# Patient Record
Sex: Male | Born: 1975 | Race: Black or African American | Hispanic: No | Marital: Married | Smoking: Former smoker
Health system: Southern US, Community
[De-identification: ages and names within clinical notes are randomized; demographics above are authoritative.]

## PROBLEM LIST (undated history)

## (undated) DIAGNOSIS — K219 Gastro-esophageal reflux disease without esophagitis: Secondary | ICD-10-CM

## (undated) DIAGNOSIS — I10 Essential (primary) hypertension: Secondary | ICD-10-CM

## (undated) DIAGNOSIS — E7800 Pure hypercholesterolemia, unspecified: Secondary | ICD-10-CM

## (undated) DIAGNOSIS — E781 Pure hyperglyceridemia: Secondary | ICD-10-CM

---

## 2005-04-11 ENCOUNTER — Emergency Department: Payer: Self-pay | Admitting: Emergency Medicine

## 2006-04-29 ENCOUNTER — Ambulatory Visit: Payer: Self-pay | Admitting: General Practice

## 2008-02-15 IMAGING — CR RIGHT ELBOW - COMPLETE 3+ VIEW
1 series · 4 of 4 positions shown · non-contrast
Comparison: none

REASON FOR EXAM: Fall, pain                                      CALL
REPORT TO DR MERDJAN: [phone]
COMMENTS:

PROCEDURE:     DXR - DXR ELBOW RT COMP W/OBLIQUES  - April 29, 2006  [DATE]
RESULT:     Four views of the LEFT elbow show no fracture, dislocation or
other acute bony abnormality.

[Series 1: view not recorded · 0.17mm/px · 4 of 4 slices shown]
[im 1/4]
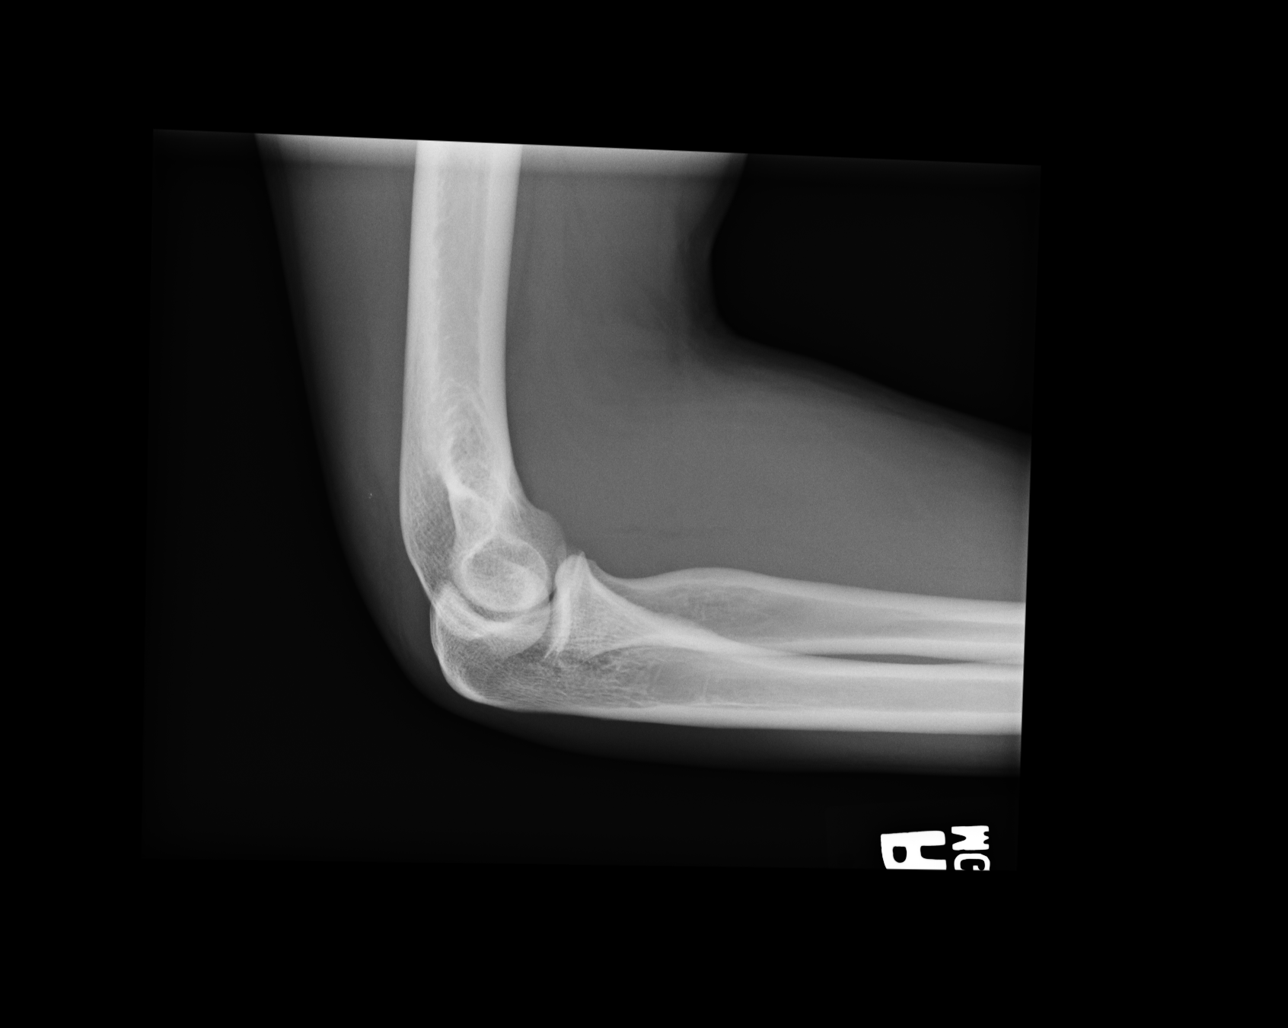
[im 2/4]
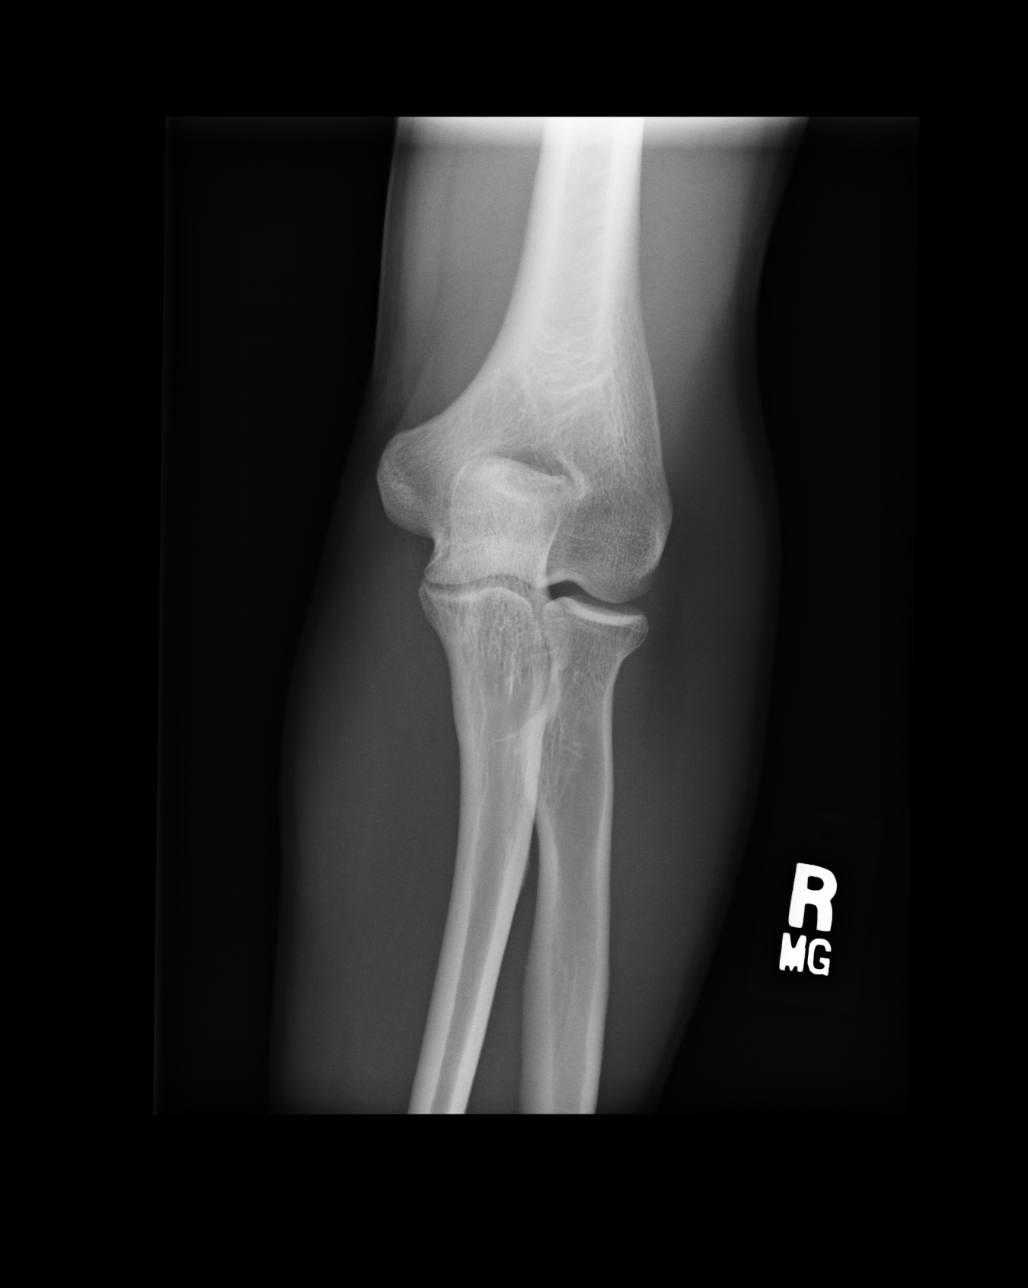
[im 3/4]
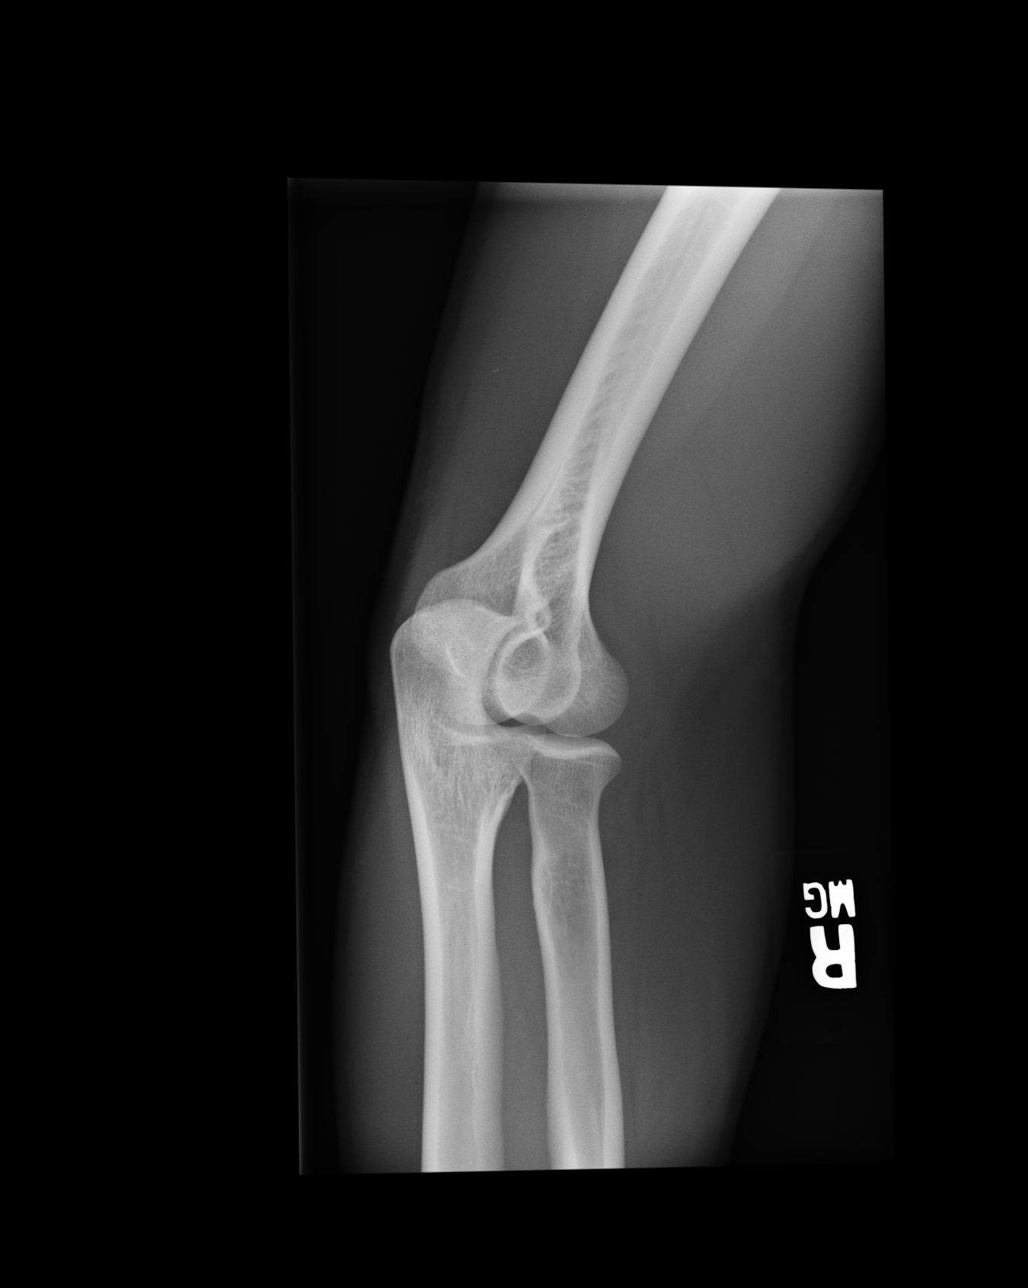
[im 4/4]
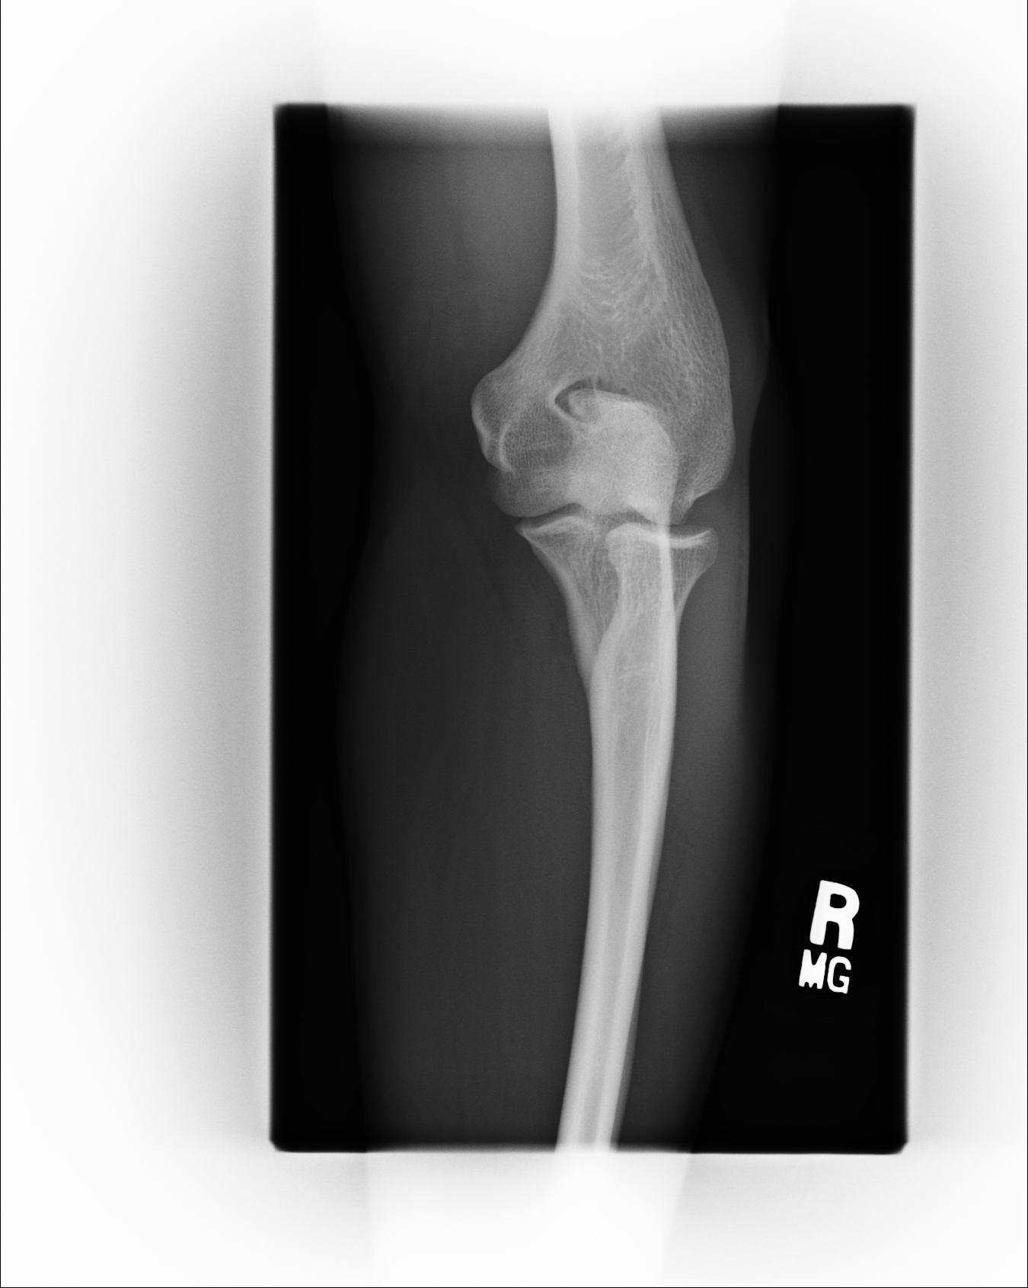

[4 of 4 positions shown; findings below may reference images not displayed]

IMPRESSION: No significant osseous abnormalities are noted.

## 2010-04-18 ENCOUNTER — Ambulatory Visit: Payer: Self-pay | Admitting: Internal Medicine

## 2010-04-24 ENCOUNTER — Ambulatory Visit: Payer: Self-pay | Admitting: Gastroenterology

## 2010-05-04 LAB — PATHOLOGY REPORT

## 2010-11-30 ENCOUNTER — Ambulatory Visit: Payer: Self-pay | Admitting: Internal Medicine

## 2018-09-10 ENCOUNTER — Encounter: Payer: Self-pay | Admitting: Emergency Medicine

## 2018-09-10 ENCOUNTER — Other Ambulatory Visit: Payer: Self-pay

## 2018-09-10 ENCOUNTER — Emergency Department: Payer: BLUE CROSS/BLUE SHIELD

## 2018-09-10 ENCOUNTER — Inpatient Hospital Stay
Admission: EM | Admit: 2018-09-10 | Discharge: 2018-09-14 | DRG: 439 | Disposition: A | Discharge status: Home / Self Care | Payer: BLUE CROSS/BLUE SHIELD | Attending: Internal Medicine | Admitting: Internal Medicine

## 2018-09-10 DIAGNOSIS — Z79899 Other long term (current) drug therapy: Secondary | ICD-10-CM

## 2018-09-10 DIAGNOSIS — K219 Gastro-esophageal reflux disease without esophagitis: Secondary | ICD-10-CM | POA: Diagnosis not present

## 2018-09-10 DIAGNOSIS — E781 Pure hyperglyceridemia: Secondary | ICD-10-CM | POA: Diagnosis present

## 2018-09-10 DIAGNOSIS — R509 Fever, unspecified: Secondary | ICD-10-CM | POA: Diagnosis not present

## 2018-09-10 DIAGNOSIS — J9811 Atelectasis: Secondary | ICD-10-CM | POA: Diagnosis not present

## 2018-09-10 DIAGNOSIS — K852 Alcohol induced acute pancreatitis without necrosis or infection: Principal | ICD-10-CM | POA: Diagnosis present

## 2018-09-10 DIAGNOSIS — Z87891 Personal history of nicotine dependence: Secondary | ICD-10-CM | POA: Diagnosis not present

## 2018-09-10 DIAGNOSIS — I10 Essential (primary) hypertension: Secondary | ICD-10-CM | POA: Diagnosis not present

## 2018-09-10 DIAGNOSIS — R109 Unspecified abdominal pain: Secondary | ICD-10-CM | POA: Diagnosis present

## 2018-09-10 DIAGNOSIS — E7800 Pure hypercholesterolemia, unspecified: Secondary | ICD-10-CM | POA: Diagnosis present

## 2018-09-10 DIAGNOSIS — K7010 Alcoholic hepatitis without ascites: Secondary | ICD-10-CM | POA: Diagnosis present

## 2018-09-10 DIAGNOSIS — E1165 Type 2 diabetes mellitus with hyperglycemia: Secondary | ICD-10-CM | POA: Diagnosis present

## 2018-09-10 DIAGNOSIS — R188 Other ascites: Secondary | ICD-10-CM | POA: Diagnosis not present

## 2018-09-10 DIAGNOSIS — F10188 Alcohol abuse with other alcohol-induced disorder: Secondary | ICD-10-CM | POA: Diagnosis present

## 2018-09-10 DIAGNOSIS — K859 Acute pancreatitis without necrosis or infection, unspecified: Secondary | ICD-10-CM | POA: Diagnosis not present

## 2018-09-10 DIAGNOSIS — Z8249 Family history of ischemic heart disease and other diseases of the circulatory system: Secondary | ICD-10-CM

## 2018-09-10 HISTORY — DX: Gastro-esophageal reflux disease without esophagitis: K21.9

## 2018-09-10 HISTORY — DX: Pure hypercholesterolemia, unspecified: E78.00

## 2018-09-10 HISTORY — DX: Pure hyperglyceridemia: E78.1

## 2018-09-10 HISTORY — DX: Essential (primary) hypertension: I10

## 2018-09-10 LAB — URINALYSIS, COMPLETE (UACMP) WITH MICROSCOPIC
Bilirubin Urine: NEGATIVE
Glucose, UA: NEGATIVE mg/dL
Ketones, ur: 80 mg/dL — AB
Leukocytes,Ua: NEGATIVE
Nitrite: NEGATIVE
PH: 5 (ref 5.0–8.0)
Protein, ur: 100 mg/dL — AB
Specific Gravity, Urine: 1.024 (ref 1.005–1.030)
Squamous Epithelial / HPF: NONE SEEN (ref 0–5)

## 2018-09-10 LAB — TROPONIN I: Troponin I: <0.03 ng/mL (ref <0.03)

## 2018-09-10 LAB — CBC WITH DIFFERENTIAL/PLATELET
Abs Immature Granulocytes: 0.08 10*3/uL — ABNORMAL HIGH (ref 0.00–0.07)
BASOS ABS: 0.1 10*3/uL (ref 0.0–0.1)
Basophils Relative: 0 %
Eosinophils Absolute: 0.2 10*3/uL (ref 0.0–0.5)
Eosinophils Relative: 1 %
HCT: 41.5 % (ref 39.0–52.0)
Hemoglobin: 14.9 g/dL (ref 13.0–17.0)
Immature Granulocytes: 1 %
Lymphocytes Relative: 16 %
Lymphs Abs: 1.9 10*3/uL (ref 0.7–4.0)
MCH: 31.6 pg (ref 26.0–34.0)
MCHC: 35.9 g/dL (ref 30.0–36.0)
MCV: 87.9 fL (ref 80.0–100.0)
Monocytes Absolute: 1 10*3/uL (ref 0.1–1.0)
Monocytes Relative: 9 %
NEUTROS ABS: 8.5 10*3/uL — AB (ref 1.7–7.7)
Neutrophils Relative %: 73 %
Platelets: 234 10*3/uL (ref 150–400)
RBC: 4.72 MIL/uL (ref 4.22–5.81)
RDW: 14.8 % (ref 11.5–15.5)
WBC: 11.6 10*3/uL — ABNORMAL HIGH (ref 4.0–10.5)
nRBC: 0 % (ref 0.0–0.2)

## 2018-09-10 LAB — COMPREHENSIVE METABOLIC PANEL
ALK PHOS: 82 U/L (ref 38–126)
ALT: 244 U/L — AB (ref 0–44)
AST: 147 U/L — ABNORMAL HIGH (ref 15–41)
Albumin: 3.7 g/dL (ref 3.5–5.0)
Anion gap: 15 (ref 5–15)
BUN: 7 mg/dL (ref 6–20)
CALCIUM: 8.6 mg/dL — AB (ref 8.9–10.3)
CO2: 20 mmol/L — ABNORMAL LOW (ref 22–32)
Chloride: 100 mmol/L (ref 98–111)
Creatinine, Ser: 0.9 mg/dL (ref 0.61–1.24)
GFR calc Af Amer: >60 mL/min (ref >60)
GFR calc non Af Amer: >60 mL/min (ref >60)
Glucose, Bld: 195 mg/dL — ABNORMAL HIGH (ref 70–99)
Potassium: 4.5 mmol/L (ref 3.5–5.1)
Sodium: 135 mmol/L (ref 135–145)
TOTAL PROTEIN: 6.6 g/dL (ref 6.5–8.1)
Total Bilirubin: 2.7 mg/dL — ABNORMAL HIGH (ref 0.3–1.2)

## 2018-09-10 LAB — LIPASE, BLOOD: Lipase: 2732 U/L — ABNORMAL HIGH (ref 11–51)

## 2018-09-10 MED ORDER — ONDANSETRON HCL 4 MG/2ML IJ SOLN
4.0000 mg | Freq: Once | INTRAMUSCULAR | Status: AC
  Administered 2018-09-10: 4 mg via INTRAVENOUS
  Filled 2018-09-10: qty 2

## 2018-09-10 MED ORDER — ACETAMINOPHEN 650 MG RE SUPP: 650.0000 mg | Freq: Four times a day (QID) | RECTAL | Status: DC | PRN

## 2018-09-10 MED ORDER — ENOXAPARIN SODIUM 40 MG/0.4ML ~~LOC~~ SOLN
40.0000 mg | SUBCUTANEOUS | Status: DC
  Administered 2018-09-10 – 2018-09-13 (×4): 40 mg via SUBCUTANEOUS
  Filled 2018-09-10 (×4): qty 0.4

## 2018-09-10 MED ORDER — SODIUM CHLORIDE 0.9 % IV SOLN
INTRAVENOUS | Status: DC
  Administered 2018-09-10 – 2018-09-13 (×6): via INTRAVENOUS

## 2018-09-10 MED ORDER — ADULT MULTIVITAMIN W/MINERALS CH
1.0000 | ORAL_TABLET | Freq: Every day | ORAL | Status: DC
  Administered 2018-09-11 – 2018-09-14 (×4): 1 via ORAL
  Filled 2018-09-10 (×4): qty 1

## 2018-09-10 MED ORDER — VITAMIN B-1 100 MG PO TABS
100.0000 mg | ORAL_TABLET | Freq: Every day | ORAL | Status: DC
  Administered 2018-09-11 – 2018-09-14 (×4): 100 mg via ORAL
  Filled 2018-09-10 (×4): qty 1

## 2018-09-10 MED ORDER — LORAZEPAM 2 MG/ML IJ SOLN
1.0000 mg | Freq: Four times a day (QID) | INTRAMUSCULAR | Status: AC | PRN
  Administered 2018-09-11: 1 mg via INTRAVENOUS
  Filled 2018-09-10: qty 1

## 2018-09-10 MED ORDER — ONDANSETRON HCL 4 MG PO TABS: 4.0000 mg | ORAL_TABLET | Freq: Four times a day (QID) | ORAL | Status: DC | PRN

## 2018-09-10 MED ORDER — HYDROCODONE-ACETAMINOPHEN 5-325 MG PO TABS
1.0000 | ORAL_TABLET | ORAL | Status: DC | PRN
  Administered 2018-09-10: 2 via ORAL
  Administered 2018-09-10 – 2018-09-12 (×3): 1 via ORAL
  Filled 2018-09-10: qty 1
  Filled 2018-09-10: qty 2
  Filled 2018-09-10 (×2): qty 1

## 2018-09-10 MED ORDER — ACETAMINOPHEN 325 MG PO TABS
650.0000 mg | ORAL_TABLET | Freq: Four times a day (QID) | ORAL | Status: DC | PRN
  Administered 2018-09-10 – 2018-09-14 (×8): 650 mg via ORAL
  Filled 2018-09-10 (×8): qty 2

## 2018-09-10 MED ORDER — FENTANYL CITRATE (PF) 100 MCG/2ML IJ SOLN
50.0000 ug | Freq: Once | INTRAMUSCULAR | Status: AC
  Administered 2018-09-10: 50 ug via INTRAVENOUS
  Filled 2018-09-10: qty 2

## 2018-09-10 MED ORDER — HYDRALAZINE HCL 20 MG/ML IJ SOLN: 10.0000 mg | Freq: Four times a day (QID) | INTRAMUSCULAR | Status: DC | PRN

## 2018-09-10 MED ORDER — SODIUM CHLORIDE 0.9% FLUSH
3.0000 mL | Freq: Once | INTRAVENOUS | Status: AC
  Administered 2018-09-10: 3 mL via INTRAVENOUS

## 2018-09-10 MED ORDER — FOLIC ACID 1 MG PO TABS
1.0000 mg | ORAL_TABLET | Freq: Every day | ORAL | Status: DC
  Administered 2018-09-11 – 2018-09-14 (×4): 1 mg via ORAL
  Filled 2018-09-10 (×4): qty 1

## 2018-09-10 MED ORDER — LIDOCAINE VISCOUS HCL 2 % MT SOLN
15.0000 mL | Freq: Once | OROMUCOSAL | Status: AC
  Administered 2018-09-10: 15 mL via ORAL
  Filled 2018-09-10: qty 15

## 2018-09-10 MED ORDER — THIAMINE HCL 100 MG/ML IJ SOLN: 100.0000 mg | Freq: Every day | INTRAMUSCULAR | Status: DC

## 2018-09-10 MED ORDER — ALUM & MAG HYDROXIDE-SIMETH 200-200-20 MG/5ML PO SUSP
30.0000 mL | Freq: Once | ORAL | Status: AC
  Administered 2018-09-10: 30 mL via ORAL
  Filled 2018-09-10: qty 30

## 2018-09-10 MED ORDER — LORAZEPAM 1 MG PO TABS
1.0000 mg | ORAL_TABLET | Freq: Four times a day (QID) | ORAL | Status: AC | PRN
  Filled 2018-09-10: qty 1

## 2018-09-10 MED ORDER — ONDANSETRON HCL 4 MG/2ML IJ SOLN: 4.0000 mg | Freq: Four times a day (QID) | INTRAMUSCULAR | Status: DC | PRN

## 2018-09-10 MED ORDER — HYDROMORPHONE HCL 1 MG/ML IJ SOLN
2.0000 mg | INTRAMUSCULAR | Status: DC | PRN
  Administered 2018-09-10 – 2018-09-11 (×2): 2 mg via INTRAVENOUS
  Filled 2018-09-10 (×2): qty 2

## 2018-09-10 MED ORDER — FAMOTIDINE IN NACL 20-0.9 MG/50ML-% IV SOLN
20.0000 mg | Freq: Once | INTRAVENOUS | Status: AC
  Administered 2018-09-10: 20 mg via INTRAVENOUS
  Filled 2018-09-10: qty 50

## 2018-09-10 NOTE — ED Provider Notes (Signed)
[facility] Emergency Department Provider Note  ____________________________________________  Time seen: Approximately 10:05 AM  I have reviewed the triage vital signs and the nursing notes.   HISTORY  Chief Complaint Abdominal Pain   HPI Calvin Bell is a 43 y.o. male with h/o GERD, alcohol abuse, and HLD who presents for evaluation of abdominal pain.  Patient reports 2 days of left upper quadrant sharp constant abdominal pain that is nonradiating.  No nausea, vomiting, diarrhea, melena, cough, chest pain, shortness of breath.  No prior abdominal surgeries.  Patient reports that he drinks several beers and shots most days of the week.  Last drink was 2 days ago.  He reports that the pain became worse after eating lunch on Friday.  The pain is currently severe.  No dysuria or hematuria.  Past Medical History:  Diagnosis Date  . Acid reflux   . Hypercholesteremia    Allergies Patient has no known allergies.  History reviewed. No pertinent family history.  Social History Social History   Tobacco Use  . Smoking status: Former Games developer  . Smokeless tobacco: Never Used  Substance Use Topics  . Alcohol use: Yes  . Drug use: Never    Review of Systems  Constitutional: Negative for fever. Eyes: Negative for visual changes. ENT: Negative for sore throat. Neck: No neck pain  Cardiovascular: Negative for chest pain. Respiratory: Negative for shortness of breath. Gastrointestinal: + LUQ abdominal pain. No vomiting or diarrhea. Genitourinary: Negative for dysuria. Musculoskeletal: Negative for back pain. Skin: Negative for rash. Neurological: Negative for headaches, weakness or numbness. Psych: No SI or HI  ____________________________________________   PHYSICAL EXAM:  VITAL SIGNS: ED Triage Vitals  Enc Vitals Group     BP 09/10/18 0950 (!) 140/104     Pulse Rate 09/10/18 0950 (!) 118     Resp --      Temp 09/10/18 0950 98.5 F (36.9 C)      Temp Source 09/10/18 0950 Oral     SpO2 09/10/18 0950 98 %     Weight 09/10/18 0930 163 lb (73.9 kg)     Height 09/10/18 0930 5\' 5"  (1.651 m)     Head Circumference --      Peak Flow --      Pain Score 09/10/18 0930 9     Pain Loc --      Pain Edu? --      Excl. in GC? --     Constitutional: Alert and oriented. Well appearing and in no apparent distress. HEENT:      Head: Normocephalic and atraumatic.         Eyes: Conjunctivae are normal. Sclera is non-icteric.       Mouth/Throat: Mucous membranes are moist.       Neck: Supple with no signs of meningismus. Cardiovascular: Tachycardia with regular rhythm. No murmurs, gallops, or rubs. 2+ symmetrical distal pulses are present in all extremities. No JVD. Respiratory: Normal respiratory effort. Lungs are clear to auscultation bilaterally. No wheezes, crackles, or rhonchi.  Gastrointestinal: Soft, tender to palpation over the epigastric and LUQ regions, and non distended with positive bowel sounds. No rebound or guarding. Genitourinary: No CVA tenderness. Musculoskeletal: Nontender with normal range of motion in all extremities. No edema, cyanosis, or erythema of extremities. Neurologic: Normal speech and language. Face is symmetric. Moving all extremities. No gross focal neurologic deficits are appreciated. Skin: Skin is warm, dry and intact. No rash noted. Psychiatric: Mood and affect are normal. Speech  and behavior are normal.  ____________________________________________   LABS (all labs ordered are listed, but only abnormal results are displayed)  Labs Reviewed  LIPASE, BLOOD - Abnormal; Notable for the following components:      Result Value   Lipase 2,732 (*)    All other components within normal limits  COMPREHENSIVE METABOLIC PANEL - Abnormal; Notable for the following components:   CO2 20 (*)    Glucose, Bld 195 (*)    Calcium 8.6 (*)    AST 147 (*)    ALT 244 (*)    Total Bilirubin 2.7 (*)    All other  components within normal limits  CBC WITH DIFFERENTIAL/PLATELET - Abnormal; Notable for the following components:   WBC 11.6 (*)    Neutro Abs 8.5 (*)    Abs Immature Granulocytes 0.08 (*)    All other components within normal limits  TROPONIN I  URINALYSIS, COMPLETE (UACMP) WITH MICROSCOPIC   ____________________________________________  EKG  ED ECG REPORT I, Nita Sickle, the attending physician, personally viewed and interpreted this ECG.  Sinus tachycardia, rate of 118, normal intervals, normal axis, no ST elevations or depressions, diffuse T wave flattening in inferior and lateral leads.  No prior for comparison. ____________________________________________  RADIOLOGY  I have personally reviewed the images performed during this visit and I agree with the Radiologist's read.   Interpretation by Radiologist:  Dg Abdomen Acute W/chest  Result Date: 09/10/2018 CLINICAL DATA:  Severe epigastric pain since yesterday. EXAM: DG ABDOMEN ACUTE W/ 1V CHEST COMPARISON:  None. FINDINGS: Lungs are clear. Cardiomediastinal silhouette and remainder of the chest is within normal. Abdominopelvic images demonstrate a nonobstructive bowel gas pattern. There is no free peritoneal air. Bones and soft tissues are unremarkable. IMPRESSION: Negative abdominal radiographs.  No acute cardiopulmonary disease. Electronically Signed   By: Elberta Fortis M.D.   On: 09/10/2018 10:34   US Abdomen Limited Ruq  Result Date: 09/10/2018 CLINICAL DATA:  Epigastric pain for 3 days. Elevated liver function tests. EXAM: ULTRASOUND ABDOMEN LIMITED RIGHT UPPER QUADRANT COMPARISON:  None. FINDINGS: Gallbladder: No gallstones or wall thickening visualized. No sonographic Murphy sign noted by sonographer. Common bile duct: Diameter: 0.3 cm. Liver: No focal lesion. Echogenicity is somewhat increased the liver appears dense. Portal vein is patent on color Doppler imaging with normal direction of blood flow towards the  liver. IMPRESSION: No acute abnormality.  Negative for gallstones. Fatty infiltration of the liver. Electronically Signed   By: Drusilla Kanner M.D.   On: 09/10/2018 12:55      ____________________________________________   PROCEDURES  Procedure(s) performed: None Procedures Critical Care performed:  None ____________________________________________   INITIAL IMPRESSION / ASSESSMENT AND PLAN / ED COURSE   43 y.o. male with h/o GERD, alcohol abuse, and HLD who presents for evaluation of LUQ/ epigastric abdominal pain x 2 days.  Patient is in no obvious distress, slightly tachycardic, abdomen is soft with tenderness to palpation in the epigastric and left upper quadrant, no pulsatile mass, no lower quadrant tenderness.  Differential diagnosis including peptic ulcer disease versus gastritis versus pancreatitis versus gallbladder disease versus diverticulitis.  Will give a GI cocktail, IV Pepcid and Zofran, will check basic labs, will get an x-ray.  EKG with no ischemic changes    _________________________ 1:12 PM on 09/10/2018 -----------------------------------------  Labs consistent with pancreatitis and elevated LFTs most likely alcohol induced.  Ultrasound was done to rule out gallstone pancreatitis and that is negative.  Patient be admitted to the hospitalist for further management.  As part of my medical decision making, I reviewed the following data within the electronic MEDICAL RECORD NUMBER Nursing notes reviewed and incorporated, Labs reviewed , EKG interpreted , Old chart reviewed, Radiograph reviewed , Discussed with admitting physician , Notes from prior ED visits and [place] Controlled Substance Database    Pertinent labs & imaging results that were available during my care of the patient were reviewed by me and considered in my medical decision making (see chart for details).    ____________________________________________   FINAL CLINICAL IMPRESSION(S) / ED DIAGNOSES   Final diagnoses:  Abdominal pain  Alcohol-induced acute pancreatitis, unspecified complication status      NEW MEDICATIONS STARTED DURING THIS VISIT:  ED Discharge Orders    None       Note:  This document was prepared using Dragon voice recognition software and may include unintentional dictation errors.    Don Perking, [place], MD 09/10/18 [phone]

## 2018-09-10 NOTE — ED Triage Notes (Signed)
LUQ pain starting 2 days ago.  Daily drinker but has not had any since Friday.  No NVD.  Got worse today.  No fevers.

## 2018-09-10 NOTE — ED Triage Notes (Signed)
FIRST NURSE NOTE-here for LUQ pain. NAD. Ambulatory to check in.

## 2018-09-10 NOTE — H&P (Signed)
[facility]   PATIENT NAME: Calvin Bell    MR#:  [id number]  DATE OF BIRTH:  March 28, 1976  DATE OF ADMISSION:  09/10/2018  PRIMARY CARE PHYSICIAN: Raynelle Bring   REQUESTING/REFERRING PHYSICIAN: Nita Sickle, MD  CHIEF COMPLAINT:   Chief Complaint  Patient presents with  . Abdominal Pain    HISTORY OF PRESENT ILLNESS: Calvin Bell  is a 43 y.o. male with a known history of GERD, hypertension and hypercholesteremia presenting to the emergency room with abdominal pain which started 2 days ago.  The pain is epigastric sharp in nature.  And it got severe today.  Patient did not have any nausea vomiting.  Patient states that he has been drinking 4-5 beers daily with few shots.  Denies previous history of having pancreatitis.         PAST MEDICAL HISTORY:   Past Medical History:  Diagnosis Date  . Acid reflux   . HTN (hypertension)   . Hypercholesteremia     PAST SURGICAL HISTORY: History reviewed. No pertinent surgical history.  SOCIAL HISTORY:  Social History   Tobacco Use  . Smoking status: Former Games developer  . Smokeless tobacco: Never Used  Substance Use Topics  . Alcohol use: Yes    FAMILY HISTORY:  Family History  Problem Relation Age of Onset  . CAD Mother     DRUG ALLERGIES: No Known Allergies  REVIEW OF SYSTEMS:   CONSTITUTIONAL: No fever, fatigue or weakness.  EYES: No blurred or double vision.  EARS, NOSE, AND THROAT: No tinnitus or ear pain.  RESPIRATORY: No cough, shortness of breath, wheezing or hemoptysis.  CARDIOVASCULAR: No chest pain, orthopnea, edema.  GASTROINTESTINAL: No nausea, vomiting, diarrhea or positive abdominal pain.  GENITOURINARY: No dysuria, hematuria.  ENDOCRINE: No polyuria, nocturia,  HEMATOLOGY: No anemia, easy bruising or bleeding SKIN: No rash or lesion. MUSCULOSKELETAL: No joint pain or arthritis.   NEUROLOGIC: No tingling, numbness, weakness.  PSYCHIATRY: No anxiety  or depression.   MEDICATIONS AT HOME:  Prior to Admission medications   Medication Sig Start Date End Date Taking? Authorizing Provider  ALPRAZolam (XANAX) 0.25 MG tablet Take 0.25 mg by mouth daily as needed. 01/13/18  Yes [provider]  atorvastatin (LIPITOR) 10 MG tablet Take 10 mg by mouth daily. 01/13/18 01/13/19 Yes [provider]  lisinopril (PRINIVIL,ZESTRIL) 10 MG tablet Take 10 mg by mouth daily. 04/04/18  Yes [provider]      PHYSICAL EXAMINATION:   VITAL SIGNS: Blood pressure 129/89, pulse (!) 125, temperature 98.5 F (36.9 C), temperature source Oral, height 5\' 5"  (1.651 m), weight 73.9 kg, SpO2 98 %.  GENERAL:  43 y.o.-year-old patient lying in the bed with no acute distress.  EYES: Pupils equal, round, reactive to light and accommodation. No scleral icterus. Extraocular muscles intact.  HEENT: Head atraumatic, normocephalic. Oropharynx and nasopharynx clear.  NECK:  Supple, no jugular venous distention. No thyroid enlargement, no tenderness.  LUNGS: Normal breath sounds bilaterally, no wheezing, rales,rhonchi or crepitation. No use of accessory muscles of respiration.  CARDIOVASCULAR: S1, S2 normal. No murmurs, rubs, or gallops.  ABDOMEN: Soft, epigastric tenderness nondistended. Bowel sounds present. No organomegaly or mass.  EXTREMITIES: No pedal edema, cyanosis, or clubbing.  NEUROLOGIC: Cranial nerves II through XII are intact. Muscle strength 5/5 in all extremities. Sensation intact. Gait not checked.  PSYCHIATRIC: The patient is alert and oriented x 3.  SKIN: No obvious rash, lesion, or ulcer.   LABORATORY PANEL:  CBC Recent Labs  Lab 09/10/18 1000  WBC 11.6*  HGB 14.9  HCT 41.5  PLT 234  MCV 87.9  MCH 31.6  MCHC 35.9  RDW 14.8  LYMPHSABS 1.9  MONOABS 1.0  EOSABS 0.2  BASOSABS 0.1   ------------------------------------------------------------------------------------------------------------------  Chemistries   Recent Labs  Lab 09/10/18 1003  NA 135  K 4.5  CL 100  CO2 20*  GLUCOSE 195*  BUN 7  CREATININE 0.90  CALCIUM 8.6*  AST 147*  ALT 244*  ALKPHOS 82  BILITOT 2.7*   ------------------------------------------------------------------------------------------------------------------ estimated creatinine clearance is 100.6 mL/min (by C-G formula based on SCr of 0.9 mg/dL). ------------------------------------------------------------------------------------------------------------------ No results for input(s): TSH, T4TOTAL, T3FREE, THYROIDAB in the last 72 hours.  Invalid input(s): FREET3   Coagulation profile No results for input(s): INR, PROTIME in the last 168 hours. ------------------------------------------------------------------------------------------------------------------- No results for input(s): DDIMER in the last 72 hours. -------------------------------------------------------------------------------------------------------------------  Cardiac Enzymes Recent Labs  Lab 09/10/18 1000  TROPONINI <0.03   ------------------------------------------------------------------------------------------------------------------ Invalid input(s): POCBNP  ---------------------------------------------------------------------------------------------------------------  Urinalysis No results found for: COLORURINE, APPEARANCEUR, LABSPEC, PHURINE, GLUCOSEU, HGBUR, BILIRUBINUR, KETONESUR, PROTEINUR, UROBILINOGEN, NITRITE, LEUKOCYTESUR   RADIOLOGY: Dg Abdomen Acute W/chest  Result Date: 09/10/2018 CLINICAL DATA:  Severe epigastric pain since yesterday. EXAM: DG ABDOMEN ACUTE W/ 1V CHEST COMPARISON:  None. FINDINGS: Lungs are clear. Cardiomediastinal silhouette and remainder of the chest is within normal. Abdominopelvic images demonstrate a nonobstructive bowel gas pattern. There is no free peritoneal air. Bones and soft tissues are unremarkable. IMPRESSION: Negative abdominal  radiographs.  No acute cardiopulmonary disease. Electronically Signed   By: Elberta Fortisaniel  Boyle M.D.   On: 09/10/2018 10:34   [place] Abdomen Limited Ruq  Result Date: 09/10/2018 CLINICAL DATA:  Epigastric pain for 3 days. Elevated liver function tests. EXAM: ULTRASOUND ABDOMEN LIMITED RIGHT UPPER QUADRANT COMPARISON:  None. FINDINGS: Gallbladder: No gallstones or wall thickening visualized. No sonographic Murphy sign noted by sonographer. Common bile duct: Diameter: 0.3 cm. Liver: No focal lesion. Echogenicity is somewhat increased the liver appears dense. Portal vein is patent on color Doppler imaging with normal direction of blood flow towards the liver. IMPRESSION: No acute abnormality.  Negative for gallstones. Fatty infiltration of the liver. Electronically Signed   By: Drusilla Kannerhomas  Dalessio M.D.   On: 09/10/2018 12:55    EKG: Orders placed or performed during the hospital encounter of 09/10/18  . EKG 12-Lead  . EKG 12-Lead  . ED EKG  . ED EKG    IMPRESSION AND PLAN: Patient is 43 year old presenting with abdominal pain  1.  Acute pancreatitis likely alcohol induced ultrasound of the abdomen shows no gallstones keep n.p.o. IV fluids supportive care Check a lipid panel in the morning  2.  Elevated liver function tests likely due to alcoholic hepatitis follow liver function in the morning  3.  Alcohol abuse CIWA protocol  4.  Upper lipidemia we will hold his home medication  5.  Hypertension we will use IV hydralazine PRN  6.  Elevated blood sugar check a hemoglobin A1c  7.  Venous Lovenox for DVT prophylaxis     All the records are reviewed and case discussed with ED provider. Management plans discussed with the patient, family and they are in agreement.  CODE STATUS:full     TOTAL TIME TAKING CARE OF THIS PATIENT: 55 minutes.    Auburn BilberryShreyang Lilu Mcglown M.D on 09/10/2018 at 1:46 PM  Between 7am to 6pm - Pager - [phone]  After 6pm go to [email or web address] - password [email or web address]  [facility]  [phone]  CC: Primary care physician; Raynelle Bring

## 2018-09-10 NOTE — ED Notes (Signed)
ED TO INPATIENT HANDOFF REPORT  ED Nurse Name and Phone #: Durene Cal 36  S Name/Age/Gender Calvin Bell 43 y.o. male Room/Bed: ED25A/ED25A  Code Status   Code Status: Not on file  Home/SNF/Other Home Patient oriented to: self, place, time and situation Is this baseline? Yes   Triage Complete: Triage complete  Chief Complaint abd pain  Triage Note FIRST NURSE NOTE-here for LUQ pain. NAD. Ambulatory to check in.  LUQ pain starting 2 days ago.  Bell drinker but has not had any since Friday.  No NVD.  Got worse today.  No fevers.     Allergies No Known Allergies  Level of Care/Admitting Diagnosis ED Disposition    ED Disposition Condition Comment   Admit  Hospital Area: [facility] [[id number]]  Level of Care: Med-Surg [16]  Diagnosis: Acute pancreatitis [577.0.ICD-9-CM]  Admitting Physician: Auburn Bilberry [[id number]]  Attending Physician: Auburn Bilberry [[id number]]  Estimated length of stay: past midnight tomorrow  Certification:: I certify this patient will need inpatient services for at least 2 midnights  PT Class (Do Not Modify): Inpatient [101]  PT Acc Code (Do Not Modify): Private [1]       B Medical/Surgery History Past Medical History:  Diagnosis Date  . Acid reflux   . Hypercholesteremia    History reviewed. No pertinent surgical history.   A IV Location/Drains/Wounds Patient Lines/Drains/Airways Status   Active Line/Drains/Airways    Name:   Placement date:   Placement time:   Site:   Days:   Peripheral IV 09/10/18 Right Arm   09/10/18    1003    Arm   less than 1          Intake/Output Last 24 hours No intake or output data in the 24 hours ending 09/10/18 1330  Labs/Imaging Results for orders placed or performed during the hospital encounter of 09/10/18 (from the past 48 hour(s))  CBC with Differential/Platelet     Status: Abnormal   Collection Time: 09/10/18 10:00 AM  Result Value Ref Range   WBC 11.6 (H) 4.0 - 10.5  K/uL   RBC 4.72 4.22 - 5.81 MIL/uL   Hemoglobin 14.9 13.0 - 17.0 g/dL   HCT 21.3 08.6 - 57.8 %   MCV 87.9 80.0 - 100.0 fL   MCH 31.6 26.0 - 34.0 pg   MCHC 35.9 30.0 - 36.0 g/dL   RDW 46.9 62.9 - 52.8 %   Platelets 234 150 - 400 K/uL   nRBC 0.0 0.0 - 0.2 %   Neutrophils Relative % 73 %   Neutro Abs 8.5 (H) 1.7 - 7.7 K/uL   Lymphocytes Relative 16 %   Lymphs Abs 1.9 0.7 - 4.0 K/uL   Monocytes Relative 9 %   Monocytes Absolute 1.0 0.1 - 1.0 K/uL   Eosinophils Relative 1 %   Eosinophils Absolute 0.2 0.0 - 0.5 K/uL   Basophils Relative 0 %   Basophils Absolute 0.1 0.0 - 0.1 K/uL   Immature Granulocytes 1 %   Abs Immature Granulocytes 0.08 (H) 0.00 - 0.07 K/uL   Polychromasia PRESENT     Comment: Performed at [facility], [address] [place], [place] [address]  Troponin I - Add-On to previous collection     Status: None   Collection Time: 09/10/18 10:00 AM  Result Value Ref Range   Troponin I <0.03 <0.03 ng/mL    Comment: Performed at [facility], [address] [place], [place] [address]  Lipase, blood  Status: Abnormal   Collection Time: 09/10/18 10:03 AM  Result Value Ref Range   Lipase 2,732 (H) 11 - 51 U/L    Comment: RESULT CONFIRMED BY MANUAL DILUTION SDR Performed at [facility], [address] [place], [place] [address]   Comprehensive metabolic panel     Status: Abnormal   Collection Time: 09/10/18 10:03 AM  Result Value Ref Range   Sodium 135 135 - 145 mmol/L    Comment: POST-ULTRACENTRIFUGATION   Potassium 4.5 3.5 - 5.1 mmol/L    Comment: HEMOLYSIS AT THIS LEVEL MAY AFFECT RESULT POST-ULTRACENTRIFUGATION    Chloride 100 98 - 111 mmol/L    Comment: POST-ULTRACENTRIFUGATION   CO2 20 (L) 22 - 32 mmol/L    Comment: POST-ULTRACENTRIFUGATION   Glucose, Bld 195 (H) 70 - 99 mg/dL    Comment: POST-ULTRACENTRIFUGATION   BUN 7 6 - 20 mg/dL   Creatinine, Ser 8.91 0.61 - 1.24 mg/dL   Calcium 8.6 (L) 8.9 - 10.3 mg/dL    Comment:  POST-ULTRACENTRIFUGATION   Total Protein 6.6 6.5 - 8.1 g/dL    Comment: POST-ULTRACENTRIFUGATION   Albumin 3.7 3.5 - 5.0 g/dL    Comment: POST-ULTRACENTRIFUGATION   AST 147 (H) 15 - 41 U/L    Comment: HEMOLYSIS AT THIS LEVEL MAY AFFECT RESULT POST-ULTRACENTRIFUGATION    ALT 244 (H) 0 - 44 U/L    Comment: HEMOLYSIS AT THIS LEVEL MAY AFFECT RESULT POST-ULTRACENTRIFUGATION    Alkaline Phosphatase 82 38 - 126 U/L   Total Bilirubin 2.7 (H) 0.3 - 1.2 mg/dL    Comment: HEMOLYSIS AT THIS LEVEL MAY AFFECT RESULT POST-ULTRACENTRIFUGATION    GFR calc non Af Amer >60 >60 mL/min   GFR calc Af Amer >60 >60 mL/min   Anion gap 15 5 - 15    Comment: Performed at [facility], [address] [place], [place] [address]   Dg Abdomen Acute W/chest  Result Date: 09/10/2018 CLINICAL DATA:  Severe epigastric pain since yesterday. EXAM: DG ABDOMEN ACUTE W/ 1V CHEST COMPARISON:  None. FINDINGS: Lungs are clear. Cardiomediastinal silhouette and remainder of the chest is within normal. Abdominopelvic images demonstrate a nonobstructive bowel gas pattern. There is no free peritoneal air. Bones and soft tissues are unremarkable. IMPRESSION: Negative abdominal radiographs.  No acute cardiopulmonary disease. Electronically Signed   By: Elberta Fortis M.D.   On: 09/10/2018 10:34   US Abdomen Limited Ruq  Result Date: 09/10/2018 CLINICAL DATA:  Epigastric pain for 3 days. Elevated liver function tests. EXAM: ULTRASOUND ABDOMEN LIMITED RIGHT UPPER QUADRANT COMPARISON:  None. FINDINGS: Gallbladder: No gallstones or wall thickening visualized. No sonographic Murphy sign noted by sonographer. Common bile duct: Diameter: 0.3 cm. Liver: No focal lesion. Echogenicity is somewhat increased the liver appears dense. Portal vein is patent on color Doppler imaging with normal direction of blood flow towards the liver. IMPRESSION: No acute abnormality.  Negative for gallstones. Fatty infiltration of the liver.  Electronically Signed   By: Drusilla Kanner M.D.   On: 09/10/2018 12:55    Pending Labs Unresulted Labs (From admission, onward)    Start     Ordered   09/10/18 0956  Urinalysis, Complete w Microscopic  ONCE - STAT,   STAT     09/10/18 0956   Signed and Held  HIV antibody (Routine Testing)  Once,   R     Signed and Held   Signed and Held  CBC  (enoxaparin (LOVENOX)    CrCl >/= 30 ml/min)  Once,  R    Comments:  Baseline for enoxaparin therapy IF NOT ALREADY DRAWN.  Notify MD if PLT < 100 K.    Signed and Held   Signed and Held  Creatinine, serum  (enoxaparin (LOVENOX)    CrCl >/= 30 ml/min)  Once,   R    Comments:  Baseline for enoxaparin therapy IF NOT ALREADY DRAWN.    Signed and Held   Signed and Held  Creatinine, serum  (enoxaparin (LOVENOX)    CrCl >/= 30 ml/min)  Weekly,   R    Comments:  while on enoxaparin therapy    Signed and Held   Signed and Held  CBC  Tomorrow morning,   R     Signed and Held   Signed and Held  Basic metabolic panel  Tomorrow morning,   R     Signed and Held          Vitals/Pain Today's Vitals   09/10/18 1137 09/10/18 1200 09/10/18 1311 09/10/18 1312  BP:  130/87 129/89   Pulse:  (!) 120 (!) 125   Temp:      TempSrc:      SpO2:  98% 98%   Weight:      Height:      PainSc: 4    8     Isolation Precautions No active isolations  Medications Medications  sodium chloride flush (NS) 0.9 % injection 3 mL (has no administration in time range)  HYDROmorphone (DILAUDID) injection 2 mg (has no administration in time range)  alum & mag hydroxide-simeth (MAALOX/MYLANTA) 200-200-20 MG/5ML suspension 30 mL (30 mLs Oral Given 09/10/18 1014)    And  lidocaine (XYLOCAINE) 2 % viscous mouth solution 15 mL (15 mLs Oral Given 09/10/18 1014)  ondansetron (ZOFRAN) injection 4 mg (4 mg Intravenous Given 09/10/18 1013)  famotidine (PEPCID) IVPB 20 mg premix (0 mg Intravenous Stopped 09/10/18 1052)  fentaNYL (SUBLIMAZE) injection 50 mcg (50 mcg Intravenous  Given 09/10/18 1033)  fentaNYL (SUBLIMAZE) injection 50 mcg (50 mcg Intravenous Given 09/10/18 1312)    Mobility walks Low fall risk   Focused Assessments GI ASSESSMENT    R Recommendations: See Admitting Provider Note  Report given to:   Additional Notes: n/a

## 2018-09-10 NOTE — ED Notes (Signed)
Patient transported to X-ray 

## 2018-09-10 NOTE — ED Notes (Signed)
ED Provider at bedside. 

## 2018-09-11 ENCOUNTER — Inpatient Hospital Stay: Payer: BLUE CROSS/BLUE SHIELD

## 2018-09-11 ENCOUNTER — Encounter: Payer: Self-pay | Admitting: Internal Medicine

## 2018-09-11 DIAGNOSIS — K852 Alcohol induced acute pancreatitis without necrosis or infection: Secondary | ICD-10-CM | POA: Diagnosis present

## 2018-09-11 LAB — HEMOGLOBIN A1C
Hgb A1c MFr Bld: 5.6 % (ref 4.8–5.6)
Mean Plasma Glucose: 114.02 mg/dL

## 2018-09-11 LAB — LIPASE, BLOOD: Lipase: 784 U/L — ABNORMAL HIGH (ref 11–51)

## 2018-09-11 LAB — CBC
HCT: 40.7 % (ref 39.0–52.0)
HEMOGLOBIN: 13.6 g/dL (ref 13.0–17.0)
MCH: 29.2 pg (ref 26.0–34.0)
MCHC: 33.4 g/dL (ref 30.0–36.0)
MCV: 87.3 fL (ref 80.0–100.0)
Platelets: 198 10*3/uL (ref 150–400)
RBC: 4.66 MIL/uL (ref 4.22–5.81)
RDW: 15.1 % (ref 11.5–15.5)
WBC: 9.3 10*3/uL (ref 4.0–10.5)
nRBC: 0 % (ref 0.0–0.2)

## 2018-09-11 LAB — HEPATIC FUNCTION PANEL
ALK PHOS: 62 U/L (ref 38–126)
ALT: 131 U/L — ABNORMAL HIGH (ref 0–44)
AST: 63 U/L — ABNORMAL HIGH (ref 15–41)
Albumin: 2.6 g/dL — ABNORMAL LOW (ref 3.5–5.0)
Bilirubin, Direct: 0.4 mg/dL — ABNORMAL HIGH (ref 0.0–0.2)
Indirect Bilirubin: 0.5 mg/dL (ref 0.3–0.9)
Total Bilirubin: 0.9 mg/dL (ref 0.3–1.2)
Total Protein: 5.1 g/dL — ABNORMAL LOW (ref 6.5–8.1)

## 2018-09-11 LAB — GLUCOSE, CAPILLARY
Glucose-Capillary: 132 mg/dL — ABNORMAL HIGH (ref 70–99)
Glucose-Capillary: 109 mg/dL — ABNORMAL HIGH (ref 70–99)
Glucose-Capillary: 234 mg/dL — ABNORMAL HIGH (ref 70–99)
Glucose-Capillary: 108 mg/dL — ABNORMAL HIGH (ref 70–99)

## 2018-09-11 LAB — LIPID PANEL
Cholesterol: 479 mg/dL — ABNORMAL HIGH (ref 0–200)
HDL: 19 mg/dL — AB (ref >40)
LDL Cholesterol: UNDETERMINED mg/dL (ref 0–99)
TRIGLYCERIDES: 1026 mg/dL — AB (ref <150)
Total CHOL/HDL Ratio: 25.2 RATIO
VLDL: UNDETERMINED mg/dL (ref 0–40)

## 2018-09-11 LAB — BASIC METABOLIC PANEL
Anion gap: 8 (ref 5–15)
BUN: 6 mg/dL (ref 6–20)
CO2: 27 mmol/L (ref 22–32)
Calcium: 6.9 mg/dL — ABNORMAL LOW (ref 8.9–10.3)
Chloride: 101 mmol/L (ref 98–111)
Creatinine, Ser: 0.9 mg/dL (ref 0.61–1.24)
GFR calc non Af Amer: >60 mL/min (ref >60)
Glucose, Bld: 141 mg/dL — ABNORMAL HIGH (ref 70–99)
Potassium: 3.9 mmol/L (ref 3.5–5.1)
SODIUM: 136 mmol/L (ref 135–145)

## 2018-09-11 LAB — LACTIC ACID, PLASMA
Lactic Acid, Venous: 1.2 mmol/L (ref 0.5–1.9)
Lactic Acid, Venous: 1.2 mmol/L (ref 0.5–1.9)

## 2018-09-11 MED ORDER — SODIUM CHLORIDE 0.9 % IV BOLUS
1000.0000 mL | Freq: Once | INTRAVENOUS | Status: AC
  Administered 2018-09-11: 1000 mL via INTRAVENOUS

## 2018-09-11 MED ORDER — METOPROLOL TARTRATE 50 MG PO TABS
50.0000 mg | ORAL_TABLET | Freq: Two times a day (BID) | ORAL | Status: DC
  Administered 2018-09-11 – 2018-09-14 (×7): 50 mg via ORAL
  Filled 2018-09-11 (×7): qty 1

## 2018-09-11 MED ORDER — GEMFIBROZIL 600 MG PO TABS
600.0000 mg | ORAL_TABLET | Freq: Two times a day (BID) | ORAL | Status: DC
  Administered 2018-09-11 – 2018-09-14 (×7): 600 mg via ORAL
  Filled 2018-09-11 (×8): qty 1

## 2018-09-11 MED ORDER — INSULIN ASPART 100 UNIT/ML ~~LOC~~ SOLN
0.0000 [IU] | SUBCUTANEOUS | Status: DC
  Administered 2018-09-11: 3 [IU] via SUBCUTANEOUS
  Administered 2018-09-11: 1 [IU] via SUBCUTANEOUS
  Filled 2018-09-11 (×2): qty 1

## 2018-09-11 MED ORDER — IOHEXOL 300 MG/ML  SOLN
100.0000 mL | Freq: Once | INTRAMUSCULAR | Status: AC | PRN
  Administered 2018-09-11: 100 mL via INTRAVENOUS

## 2018-09-11 MED ORDER — IOPAMIDOL (ISOVUE-300) INJECTION 61%
15.0000 mL | INTRAVENOUS | Status: AC
  Administered 2018-09-11 (×2): 15 mL via ORAL

## 2018-09-11 MED ORDER — SODIUM CHLORIDE 0.9 % IV SOLN
1.0000 g | Freq: Three times a day (TID) | INTRAVENOUS | Status: DC
  Administered 2018-09-11 – 2018-09-13 (×6): 1 g via INTRAVENOUS
  Filled 2018-09-11 (×8): qty 1

## 2018-09-11 NOTE — Progress Notes (Signed)
Referral received from the patient's nurse for a visit as an invitation to talk about what may be no his mind. Upon arrival, the patient was reclined in his bed. He elevated the head of the bed as I entered the room. He was pleasant and conversational, but mentioned that he was resting. He is unsure of when he will be able to go home, but indicated that it is largely based upon anticipated lab results. He was appreciative of the visit and indicated no acute needs at this time. I will follow up with him at a better/later time    09/11/18 1300  Clinical Encounter Type  Visited With Patient  Visit Type Initial  Referral From Nurse  Stress Factors  Patient Stress Factors Not reviewed  .

## 2018-09-11 NOTE — Progress Notes (Signed)
Pharmacy Antibiotic Note  Calvin Bell is a 43 y.o. male admitted on 09/10/2018 with fever, IAI and pancreatits.  Pharmacy has been consulted for meropenem dosing.   Plan: Meropenem 1g IV q8h   Height: 5\' 5"  (165.1 cm) Weight: 162 lb 14.7 oz (73.9 kg) IBW/kg (Calculated) : 61.5  Temp (24hrs), Avg:100 F (37.8 C), Min:98.6 F (37 C), Max:101.5 F (38.6 C)  Recent Labs  Lab 09/10/18 1000 09/10/18 1003 09/11/18 0329 09/11/18 0914  WBC 11.6*  --  9.3  --   CREATININE  --  0.90 0.90  --   LATICACIDVEN  --   --   --  1.2    Estimated Creatinine Clearance: 100.6 mL/min (by C-G formula based on SCr of 0.9 mg/dL).    No Known Allergies  Antimicrobials this admission: Meropenem 3/23 >>  Dose adjustments this admission:   Microbiology results: 3/23 BCx: ordered   Thank you for allowing pharmacy to be a part of this patient's care.  Marty Heck 09/11/2018 10:59 AM

## 2018-09-11 NOTE — Progress Notes (Signed)
[facility] [place] [facility]   PATIENT NAME: Calvin Bell    MR#:  [id number]  DATE OF BIRTH:  06-21-1976  SUBJECTIVE:  CHIEF COMPLAINT:   Chief Complaint  Patient presents with  . Abdominal Pain   Continues to have abdominal pain and nausea.  No vomiting.  Febrile temperature 101.5.  Tachycardia  Patient had history of hyperglycemia and hypertriglyceridemia.  Was on lifestyle modifications along with atorvastatin.  Has not been taking this medication.  REVIEW OF SYSTEMS:    Review of Systems  Constitutional: Positive for malaise/fatigue. Negative for chills, fever and weight loss.  HENT: Negative for hearing loss and nosebleeds.   Eyes: Negative for blurred vision, double vision and pain.  Respiratory: Negative for cough, hemoptysis, sputum production, shortness of breath and wheezing.   Cardiovascular: Negative for chest pain, palpitations, orthopnea and leg swelling.  Gastrointestinal: Positive for abdominal pain and nausea. Negative for constipation, diarrhea and vomiting.  Genitourinary: Negative for dysuria and hematuria.  Musculoskeletal: Negative for back pain, falls and myalgias.  Skin: Negative for rash.  Neurological: Negative for dizziness, tremors, sensory change, speech change, focal weakness, seizures and headaches.  Endo/Heme/Allergies: Does not bruise/bleed easily.  Psychiatric/Behavioral: Negative for depression and memory loss. The patient is not nervous/anxious.     DRUG ALLERGIES:  No Known Allergies  VITALS:  Blood pressure 120/82, pulse (!) 109, temperature 98.7 F (37.1 C), temperature source Oral, resp. rate 17, height 5\' 5"  (1.651 m), weight 73.9 kg, SpO2 98 %.  PHYSICAL EXAMINATION:   Physical Exam  GENERAL:  43 y.o.-year-old patient lying in the bed with no acute distress.  EYES: Pupils equal, round, reactive to light and accommodation. No scleral icterus. Extraocular muscles intact.  HEENT: Head atraumatic,  normocephalic. Oropharynx and nasopharynx clear.  NECK:  Supple, no jugular venous distention. No thyroid enlargement, no tenderness.  LUNGS: Normal breath sounds bilaterally, no wheezing, rales, rhonchi. No use of accessory muscles of respiration.  CARDIOVASCULAR: S1, S2. tachycardia ABDOMEN: Soft, diffuse tenderness, nondistended. Bowel sounds present. No organomegaly or mass.  EXTREMITIES: No cyanosis, clubbing or edema b/l.    NEUROLOGIC: Cranial nerves II through XII are intact. No focal Motor or sensory deficits b/l.   PSYCHIATRIC: The patient is alert and oriented x 3.  SKIN: No obvious rash, lesion, or ulcer.   LABORATORY PANEL:   CBC Recent Labs  Lab 09/11/18 0329  WBC 9.3  HGB 13.6  HCT 40.7  PLT 198   ------------------------------------------------------------------------------------------------------------------ Chemistries  Recent Labs  Lab 09/11/18 0329  NA 136  K 3.9  CL 101  CO2 27  GLUCOSE 141*  BUN 6  CREATININE 0.90  CALCIUM 6.9*  AST 63*  ALT 131*  ALKPHOS 62  BILITOT 0.9   ------------------------------------------------------------------------------------------------------------------  Cardiac Enzymes Recent Labs  Lab 09/10/18 1000  TROPONINI <0.03   ------------------------------------------------------------------------------------------------------------------  RADIOLOGY:  Dg Abdomen Acute W/chest  Result Date: 09/10/2018 CLINICAL DATA:  Severe epigastric pain since yesterday. EXAM: DG ABDOMEN ACUTE W/ 1V CHEST COMPARISON:  None. FINDINGS: Lungs are clear. Cardiomediastinal silhouette and remainder of the chest is within normal. Abdominopelvic images demonstrate a nonobstructive bowel gas pattern. There is no free peritoneal air. Bones and soft tissues are unremarkable. IMPRESSION: Negative abdominal radiographs.  No acute cardiopulmonary disease. Electronically Signed   By: Elberta Fortis M.D.   On: 09/10/2018 10:34   US Abdomen Limited  Ruq  Result Date: 09/10/2018 CLINICAL DATA:  Epigastric pain for 3 days. Elevated liver function tests. EXAM: ULTRASOUND  ABDOMEN LIMITED RIGHT UPPER QUADRANT COMPARISON:  None. FINDINGS: Gallbladder: No gallstones or wall thickening visualized. No sonographic Murphy sign noted by sonographer. Common bile duct: Diameter: 0.3 cm. Liver: No focal lesion. Echogenicity is somewhat increased the liver appears dense. Portal vein is patent on color Doppler imaging with normal direction of blood flow towards the liver. IMPRESSION: No acute abnormality.  Negative for gallstones. Fatty infiltration of the liver. Electronically Signed   By: Drusilla Kanner M.D.   On: 09/10/2018 12:55     ASSESSMENT AND PLAN:   Patient is 43 year old presenting with abdominal pain  *Sepsis Fever and tachycardia.  Etiology unclear.  Will check a stat CT scan of the abdomen pelvis.  Stat lactic acid ordered. Normal saline bolus.  *  Acute alcoholic pancreatitis No gallstones on ultrasound. Triglycerides 1045.  Could also be contributing to his pancreatitis IV fluids. 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  Presently n.p.o.  We will start him back on his atorvastatin from home.  Add Lopid.  *Diabetes mellitus.  Check hemoglobin A1c.  Sliding scale insulin added.  *  Elevated liver function tests likely due to alcoholic hepatitis follow liver function in the morning  *  Alcohol abuse CIWA protocol  *  Hypertension we will use IV hydralazine PRN  *  Venous Lovenox for DVT prophylaxis  All the records are reviewed and case discussed with Care Management/Social Worker Management plans discussed with the patient, family and they are in agreement.  CODE STATUS: FULL CODE  DVT Prophylaxis: SCDs  TOTAL CC TIME TAKING CARE OF THIS PATIENT: 35 minutes.   POSSIBLE D/C IN 2-3 DAYS, DEPENDING ON CLINICAL CONDITION.  Molinda Bailiff Candies Palm M.D on 09/11/2018 at 10:28  AM  Between 7am to 6pm - Pager - [phone]  After 6pm go to [email or web address] - password [email or web address]  [facility]  Office  [phone]  CC: Primary care physician; Raynelle Bring  Note: This dictation was prepared with Dragon dictation along with smaller phrase technology. Any transcriptional errors that result from this process are unintentional.

## 2018-09-12 ENCOUNTER — Encounter: Payer: Self-pay | Admitting: Internal Medicine

## 2018-09-12 LAB — COMPREHENSIVE METABOLIC PANEL
ALT: 78 U/L — ABNORMAL HIGH (ref 0–44)
AST: 40 U/L (ref 15–41)
Albumin: 2.5 g/dL — ABNORMAL LOW (ref 3.5–5.0)
Alkaline Phosphatase: 53 U/L (ref 38–126)
Anion gap: 7 (ref 5–15)
BUN: 6 mg/dL (ref 6–20)
CO2: 20 mmol/L — ABNORMAL LOW (ref 22–32)
Calcium: 7 mg/dL — ABNORMAL LOW (ref 8.9–10.3)
Chloride: 108 mmol/L (ref 98–111)
Creatinine, Ser: 0.88 mg/dL (ref 0.61–1.24)
GFR calc Af Amer: >60 mL/min (ref >60)
GFR calc non Af Amer: >60 mL/min (ref >60)
Glucose, Bld: 108 mg/dL — ABNORMAL HIGH (ref 70–99)
Potassium: 3.4 mmol/L — ABNORMAL LOW (ref 3.5–5.1)
Sodium: 135 mmol/L (ref 135–145)
Total Bilirubin: 1.2 mg/dL (ref 0.3–1.2)
Total Protein: 5.5 g/dL — ABNORMAL LOW (ref 6.5–8.1)

## 2018-09-12 LAB — CBC WITH DIFFERENTIAL/PLATELET
ABS IMMATURE GRANULOCYTES: 0.08 10*3/uL — AB (ref 0.00–0.07)
Basophils Absolute: 0 10*3/uL (ref 0.0–0.1)
Basophils Relative: 0 %
Eosinophils Absolute: 0.2 10*3/uL (ref 0.0–0.5)
Eosinophils Relative: 2 %
HCT: 34.4 % — ABNORMAL LOW (ref 39.0–52.0)
Hemoglobin: 11.3 g/dL — ABNORMAL LOW (ref 13.0–17.0)
Immature Granulocytes: 1 %
Lymphocytes Relative: 16 %
Lymphs Abs: 1.6 10*3/uL (ref 0.7–4.0)
MCH: 28.8 pg (ref 26.0–34.0)
MCHC: 32.8 g/dL (ref 30.0–36.0)
MCV: 87.5 fL (ref 80.0–100.0)
Monocytes Absolute: 1 10*3/uL (ref 0.1–1.0)
Monocytes Relative: 10 %
NEUTROS ABS: 6.9 10*3/uL (ref 1.7–7.7)
Neutrophils Relative %: 71 %
Platelets: 153 10*3/uL (ref 150–400)
RBC: 3.93 MIL/uL — ABNORMAL LOW (ref 4.22–5.81)
RDW: 15.6 % — ABNORMAL HIGH (ref 11.5–15.5)
WBC: 9.9 10*3/uL (ref 4.0–10.5)
nRBC: 0 % (ref 0.0–0.2)

## 2018-09-12 LAB — GLUCOSE, CAPILLARY
Glucose-Capillary: 116 mg/dL — ABNORMAL HIGH (ref 70–99)
Glucose-Capillary: 89 mg/dL (ref 70–99)
Glucose-Capillary: 96 mg/dL (ref 70–99)
Glucose-Capillary: 98 mg/dL (ref 70–99)

## 2018-09-12 LAB — HIV ANTIBODY (ROUTINE TESTING W REFLEX): HIV Screen 4th Generation wRfx: NONREACTIVE

## 2018-09-12 LAB — LIPASE, BLOOD: Lipase: 173 U/L — ABNORMAL HIGH (ref 11–51)

## 2018-09-12 LAB — PROCALCITONIN: Procalcitonin: 2.48 ng/mL

## 2018-09-12 LAB — TRIGLYCERIDES: Triglycerides: 421 mg/dL — ABNORMAL HIGH (ref <150)

## 2018-09-12 MED ORDER — ATORVASTATIN CALCIUM 20 MG PO TABS
40.0000 mg | ORAL_TABLET | Freq: Every day | ORAL | Status: DC
  Administered 2018-09-12 – 2018-09-13 (×2): 40 mg via ORAL
  Filled 2018-09-12 (×2): qty 2

## 2018-09-12 MED ORDER — POTASSIUM CHLORIDE CRYS ER 20 MEQ PO TBCR
40.0000 meq | EXTENDED_RELEASE_TABLET | Freq: Once | ORAL | Status: AC
  Administered 2018-09-12: 40 meq via ORAL
  Filled 2018-09-12: qty 2

## 2018-09-12 NOTE — Progress Notes (Signed)
Patient spiked another temp, tyenol given and MD aware.  Suzan Slick, RN

## 2018-09-12 NOTE — Progress Notes (Addendum)
[facility] - [place] [facility]   PATIENT NAME: Calvin Bell    MR#:  [id number]  DATE OF BIRTH:  10/15/1975  SUBJECTIVE:  CHIEF COMPLAINT:   Chief Complaint  Patient presents with  . Abdominal Pain   Abdominal pain improved.  On clear liquid diet Small bowel movement yesterday  REVIEW OF SYSTEMS:    Review of Systems  Constitutional: Positive for malaise/fatigue. Negative for chills, fever and weight loss.  HENT: Negative for hearing loss and nosebleeds.   Eyes: Negative for blurred vision, double vision and pain.  Respiratory: Negative for cough, hemoptysis, sputum production, shortness of breath and wheezing.   Cardiovascular: Negative for chest pain, palpitations, orthopnea and leg swelling.  Gastrointestinal: Positive for abdominal pain and nausea. Negative for constipation, diarrhea and vomiting.  Genitourinary: Negative for dysuria and hematuria.  Musculoskeletal: Negative for back pain, falls and myalgias.  Skin: Negative for rash.  Neurological: Negative for dizziness, tremors, sensory change, speech change, focal weakness, seizures and headaches.  Endo/Heme/Allergies: Does not bruise/bleed easily.  Psychiatric/Behavioral: Negative for depression and memory loss. The patient is not nervous/anxious.     DRUG ALLERGIES:  No Known Allergies  VITALS:  Blood pressure 129/86, pulse (!) 118, temperature (!) 100.5 F (38.1 C), temperature source Oral, resp. rate 16, height 5\' 5"  (1.651 m), weight 73.9 kg, SpO2 97 %.  PHYSICAL EXAMINATION:   Physical Exam  GENERAL:  43 y.o.-year-old patient lying in the bed with no acute distress.  EYES: Pupils equal, round, reactive to light and accommodation. No scleral icterus. Extraocular muscles intact.  HEENT: Head atraumatic, normocephalic. Oropharynx and nasopharynx clear.  NECK:  Supple, no jugular venous distention. No thyroid enlargement, no tenderness.  LUNGS: Normal breath sounds bilaterally, no  wheezing, rales, rhonchi. No use of accessory muscles of respiration.  CARDIOVASCULAR: S1, S2. tachycardia ABDOMEN: Soft, diffuse tenderness, nondistended. Bowel sounds present. No organomegaly or mass.  EXTREMITIES: No cyanosis, clubbing or edema b/l.    NEUROLOGIC: Cranial nerves II through XII are intact. No focal Motor or sensory deficits b/l.   PSYCHIATRIC: The patient is alert and oriented x 3.  SKIN: No obvious rash, lesion, or ulcer.   LABORATORY PANEL:   CBC Recent Labs  Lab 09/12/18 0444  WBC 9.9  HGB 11.3*  HCT 34.4*  PLT 153   ------------------------------------------------------------------------------------------------------------------ Chemistries  Recent Labs  Lab 09/12/18 0444  NA 135  K 3.4*  CL 108  CO2 20*  GLUCOSE 108*  BUN 6  CREATININE 0.88  CALCIUM 7.0*  AST 40  ALT 78*  ALKPHOS 53  BILITOT 1.2   ------------------------------------------------------------------------------------------------------------------  Cardiac Enzymes Recent Labs  Lab 09/10/18 1000  TROPONINI <0.03   ------------------------------------------------------------------------------------------------------------------  RADIOLOGY:  Ct Abdomen Pelvis W Contrast  Result Date: 09/11/2018 CLINICAL DATA:  Acute pancreatitis, leukocytosis, fever EXAM: CT ABDOMEN AND PELVIS WITH CONTRAST TECHNIQUE: Multidetector CT imaging of the abdomen and pelvis was performed using the standard protocol following bolus administration of intravenous contrast. Sagittal and coronal MPR images reconstructed from axial data set. CONTRAST:  OMNIPAQUE IOHEXOL 300 MG/ML SOLN IV. Dilute oral contrast. COMPARISON:  None FINDINGS: Lower chest: Mild bibasilar atelectasis and tiny LEFT pleural effusion Hepatobiliary: Fatty infiltration of liver. Gallbladder and liver otherwise normal appearance. Pancreas: Diffuse peripancreatic edema consistent with acute pancreatitis. Extensive edema in  peripancreatic soft tissues, BILATERAL anterior pararenal spaces, extending to the lateral conal fascia bilaterally LEFT LEFT greater than RIGHT. Fluid extends into the mesentery and caudally in the LEFT pericolic gutter  as well as surrounding both the descending and ascending colon. No pancreatic necrosis, mass, ductal dilatation or calcification. No discrete pseudocyst. Spleen: Normal appearance Adrenals/Urinary Tract: Adrenal glands, kidneys, ureters, and bladder normal appearance Stomach/Bowel: Normal appendix. Wall thickening of the posterior gastric antral wall consistent with adjacent pancreatitis. Additional bowel wall thickening of the duodenal walls blocking adjacent to the pancreas. Bowel loops otherwise unremarkable. Vascular/Lymphatic: Aorta normal caliber. Vascular structures patent, including portal vein, splenic vein, superior mesenteric vein. Reproductive: Unremarkable prostate gland and seminal vesicles Other: Small amount of fluid in the prevesical space. Scattered low volume ascites in pelvis, and gutters, and in lesser sac. No free air. No hernia. Musculoskeletal: Unremarkable IMPRESSION: Acute pancreatitis with extensive peripancreatic edema and small volume ascites. No acute complications of pancreatitis visualized at present. Fatty infiltration of liver. Electronically Signed   By: Ulyses Southward M.D.   On: 09/11/2018 11:28   US Abdomen Limited Ruq  Result Date: 09/10/2018 CLINICAL DATA:  Epigastric pain for 3 days. Elevated liver function tests. EXAM: ULTRASOUND ABDOMEN LIMITED RIGHT UPPER QUADRANT COMPARISON:  None. FINDINGS: Gallbladder: No gallstones or wall thickening visualized. No sonographic Murphy sign noted by sonographer. Common bile duct: Diameter: 0.3 cm. Liver: No focal lesion. Echogenicity is somewhat increased the liver appears dense. Portal vein is patent on color Doppler imaging with normal direction of blood flow towards the liver. IMPRESSION: No acute abnormality.   Negative for gallstones. Fatty infiltration of the liver. Electronically Signed   By: Drusilla Kanner M.D.   On: 09/10/2018 12:55     ASSESSMENT AND PLAN:   Patient is 43 year old presenting with abdominal pain  *Sepsis Fever and tachycardia.  Etiology unclear.   CT scan of the abdomen and pelvis showed pancreatitis.  No necrosis or abscess. Started on meropenem.  Waiting on blood cultures.  *  Acute alcoholic pancreatitis No gallstones on ultrasound. Triglycerides 1045.  Could also be contributing to his pancreatitis IV fluids. Pain medications as needed  *Hypertriglyceridemia.  Improved to 400 today Due to alcohol. Restarted atorvastatin from home that he was not taking.  Added gemfibrozil.  *Hyperglycemia.  Likely due to acute stress.  Hemoglobin A1c is 5.6.  *  Elevated liver function tests likely due to alcoholic hepatitis follow liver function in the morning  *  Alcohol abuse CIWA protocol  *  Hypertension we will use IV hydralazine PRN  *  Venous Lovenox for DVT prophylaxis  All the records are reviewed and case discussed with Care Management/Social Worker Management plans discussed with the patient, family and they are in agreement.  CODE STATUS: FULL CODE  DVT Prophylaxis: SCDs  TOTAL TIME TAKING CARE OF THIS PATIENT: 35 minutes.   POSSIBLE D/C IN 1-2 DAYS, DEPENDING ON CLINICAL CONDITION.  Molinda Bailiff Geisha Abernathy M.D on 09/12/2018 at 10:58 AM  Between 7am to 6pm - Pager - [phone]  After 6pm go to [email or web address] - password [email or web address]  [facility] [place] [facility]  Office  [phone]  CC: Primary care physician; Raynelle Bring  Note: This dictation was prepared with Dragon dictation along with smaller phrase technology. Any transcriptional errors that result from this process are unintentional.

## 2018-09-13 LAB — COMPREHENSIVE METABOLIC PANEL
ALT: 71 U/L — ABNORMAL HIGH (ref 0–44)
AST: 48 U/L — ABNORMAL HIGH (ref 15–41)
Albumin: 2.6 g/dL — ABNORMAL LOW (ref 3.5–5.0)
Alkaline Phosphatase: 67 U/L (ref 38–126)
Anion gap: 9 (ref 5–15)
BUN: 8 mg/dL (ref 6–20)
CHLORIDE: 108 mmol/L (ref 98–111)
CO2: 18 mmol/L — ABNORMAL LOW (ref 22–32)
CREATININE: 0.77 mg/dL (ref 0.61–1.24)
Calcium: 8.1 mg/dL — ABNORMAL LOW (ref 8.9–10.3)
Glucose, Bld: 117 mg/dL — ABNORMAL HIGH (ref 70–99)
Potassium: 3.7 mmol/L (ref 3.5–5.1)
Sodium: 135 mmol/L (ref 135–145)
Total Bilirubin: 1.2 mg/dL (ref 0.3–1.2)
Total Protein: 6.4 g/dL — ABNORMAL LOW (ref 6.5–8.1)

## 2018-09-13 LAB — CBC WITH DIFFERENTIAL/PLATELET
ABS IMMATURE GRANULOCYTES: 0.17 10*3/uL — AB (ref 0.00–0.07)
Basophils Absolute: 0 10*3/uL (ref 0.0–0.1)
Basophils Relative: 0 %
Eosinophils Absolute: 0.2 10*3/uL (ref 0.0–0.5)
Eosinophils Relative: 2 %
HCT: 33.7 % — ABNORMAL LOW (ref 39.0–52.0)
Hemoglobin: 11.1 g/dL — ABNORMAL LOW (ref 13.0–17.0)
IMMATURE GRANULOCYTES: 2 %
Lymphocytes Relative: 14 %
Lymphs Abs: 1.4 10*3/uL (ref 0.7–4.0)
MCH: 29.1 pg (ref 26.0–34.0)
MCHC: 32.9 g/dL (ref 30.0–36.0)
MCV: 88.5 fL (ref 80.0–100.0)
Monocytes Absolute: 1 10*3/uL (ref 0.1–1.0)
Monocytes Relative: 10 %
NEUTROS PCT: 72 %
Neutro Abs: 7.4 10*3/uL (ref 1.7–7.7)
Platelets: 174 10*3/uL (ref 150–400)
RBC: 3.81 MIL/uL — ABNORMAL LOW (ref 4.22–5.81)
RDW: 15.8 % — ABNORMAL HIGH (ref 11.5–15.5)
WBC: 10.3 10*3/uL (ref 4.0–10.5)
nRBC: 0 % (ref 0.0–0.2)

## 2018-09-13 LAB — TRIGLYCERIDES: TRIGLYCERIDES: 452 mg/dL — AB (ref <150)

## 2018-09-13 LAB — LIPASE, BLOOD: Lipase: 100 U/L — ABNORMAL HIGH (ref 11–51)

## 2018-09-13 MED ORDER — PIPERACILLIN-TAZOBACTAM 3.375 G IVPB
3.3750 g | Freq: Three times a day (TID) | INTRAVENOUS | Status: DC
  Administered 2018-09-13 – 2018-09-14 (×4): 3.375 g via INTRAVENOUS
  Filled 2018-09-13 (×4): qty 50

## 2018-09-13 NOTE — Progress Notes (Signed)
Pharmacy Antibiotic Note  Calvin Bell is a 43 y.o. male admitted on 09/10/2018 with fever, IAI and pancreatits.  Pharmacy has been consulted for meropenem dosing. Additionally, patient had an overnight spike in fever and was treated with APAP. Patient will need to remain on Meropenem.   Plan: Continue Meropenem 1g IV q8h   Height: 5\' 5"  (165.1 cm) Weight: 162 lb 14.7 oz (73.9 kg) IBW/kg (Calculated) : 61.5  Temp (24hrs), Avg:100.7 F (38.2 C), Min:99 F (37.2 C), Max:103.1 F (39.5 C)  Recent Labs  Lab 09/10/18 1000 09/10/18 1003 09/11/18 0329 09/11/18 0914 09/11/18 1136 09/12/18 0444 09/13/18 0329  WBC 11.6*  --  9.3  --   --  9.9 10.3  CREATININE  --  0.90 0.90  --   --  0.88 0.77  LATICACIDVEN  --   --   --  1.2 1.2  --   --     Estimated Creatinine Clearance: 112 mL/min (by C-G formula based on SCr of 0.77 mg/dL).    No Known Allergies  Antimicrobials this admission: Meropenem 3/23 >>  Dose adjustments this admission: N/A  Microbiology results: 3/23 BCx: pending   Thank you for allowing pharmacy to be a part of this patient's care.  Katha Cabal 09/13/2018 8:24 AM

## 2018-09-14 DIAGNOSIS — R188 Other ascites: Secondary | ICD-10-CM

## 2018-09-14 DIAGNOSIS — R509 Fever, unspecified: Secondary | ICD-10-CM

## 2018-09-14 DIAGNOSIS — K219 Gastro-esophageal reflux disease without esophagitis: Secondary | ICD-10-CM

## 2018-09-14 DIAGNOSIS — I10 Essential (primary) hypertension: Secondary | ICD-10-CM

## 2018-09-14 DIAGNOSIS — Z87891 Personal history of nicotine dependence: Secondary | ICD-10-CM

## 2018-09-14 DIAGNOSIS — E7800 Pure hypercholesterolemia, unspecified: Secondary | ICD-10-CM

## 2018-09-14 DIAGNOSIS — K859 Acute pancreatitis without necrosis or infection, unspecified: Secondary | ICD-10-CM

## 2018-09-14 MED ORDER — HYDROCODONE-ACETAMINOPHEN 5-325 MG PO TABS: 1.0000 | ORAL_TABLET | Freq: Four times a day (QID) | ORAL | 0 refills | Status: AC | PRN

## 2018-09-14 MED ORDER — LISINOPRIL 10 MG PO TABS: 10.0000 mg | ORAL_TABLET | Freq: Every day | ORAL | 0 refills | Status: AC

## 2018-09-14 NOTE — Progress Notes (Signed)
   09/14/18 1350  Clinical Encounter Type  Visited With Patient  Visit Type Follow-up  Referral From Nurse   Chaplain followed up with the patient via phone. He noted that he is anxious to be discharged, but is awaiting a visit from the Infectious Diseases doctor. The patient noted that he is feeling much better each day. He stated that he had a fever over night, but that his temperature has been stable today. The chaplain offered to check in with the patient's nurse for an update on when the Infectious Diseases doctor anticipates coming by.

## 2018-09-14 NOTE — Progress Notes (Signed)
Discharge instructions given to pt. IV removed. No further questions from pt. Pt dressed and discharged home with daughter.

## 2018-09-14 NOTE — Discharge Instructions (Signed)
Quit alcohol  Low fat diet

## 2018-09-14 NOTE — Consult Note (Signed)
NAME: Calvin Bell  DOB: [birth year]  MRN: [id number]  Date/Time: 09/14/2018 1:20 PM  REQUESTING PROVIDER:sudini Subjective:  REASON FOR CONSULT: fever ? TALLAN Bell is a 43 y.o.male  with a history of Hypercholeterolemia, , HTN, GERD admitted with abdominal pain of 3 days duration- pt presented on Sunday with abdominal pain of 2 days duration.it was in epigastrium and left upper quadrant- initally was just bloating sensation and he thought it was Gas but later was severe, crampy and he could not catch his breath He was found to have acute pancreatitis. He initially was kept NPO and now has been  progressed to solid food- I am asked to see him for fever. Pt is doing well- he does not feel the fever or has swats, abdominal pain is much improved and he has been taking solids since yesterday. HE has had fever since admission and initially given meropenem and changed to zosyn on 3/25. He has had a bowel movt He drinks a bit heavy every day 2 X 24 co beer and 3-4 shots of tequila. He works in [employer] in [employer]. Dr.Hande in [facility] is his PCP . Past Medical History:  Diagnosis Date  . Acid reflux   . HTN (hypertension)   . Hypercholesteremia   . Hypertriglyceridemia     History reviewed. No pertinent surgical history.  Social History   Socioeconomic History  . Marital status: Married    Spouse name: Not on file  . Number of children: Not on file  . Years of education: Not on file  . Highest education level: Not on file  Occupational History  . Not on file  Social Needs  . Financial resource strain: Not on file  . Food insecurity:    Worry: Not on file    Inability: Not on file  . Transportation needs:    Medical: Not on file    Non-medical: Not on file  Tobacco Use  . Smoking status: Former Games developer  . Smokeless tobacco: Never Used  Substance and Sexual Activity  . Alcohol use: Yes  . Drug use: Never  . Sexual activity: Not on file  Lifestyle  . Physical  activity:    Days per week: Not on file    Minutes per session: Not on file  . Stress: Not on file  Relationships  . Social connections:    Talks on phone: Not on file    Gets together: Not on file    Attends religious service: Not on file    Active member of club or organization: Not on file    Attends meetings of clubs or organizations: Not on file    Relationship status: Not on file  . Intimate partner violence:    Fear of current or ex partner: Not on file    Emotionally abused: Not on file    Physically abused: Not on file    Forced sexual activity: Not on file  Other Topics Concern  . Not on file  Social History Narrative  . Not on file    Family History  Problem Relation Age of Onset  . CAD Mother    No Known Allergies  ? Current Facility-Administered Medications  Medication Dose Route Frequency Provider Last Rate Last Dose  . acetaminophen (TYLENOL) tablet 650 mg  650 mg Oral Q6H PRN Auburn Bilberry, MD   650 mg at 09/14/18 0446   Or  . acetaminophen (TYLENOL) suppository 650 mg  650 mg Rectal Q6H PRN Allena Katz,  Shreyang, MD      . atorvastatin (LIPITOR) tablet 40 mg  40 mg Oral q1800 Milagros Loll, MD   40 mg at 09/13/18 1721  . enoxaparin (LOVENOX) injection 40 mg  40 mg Subcutaneous Q24H Auburn Bilberry, MD   40 mg at 09/13/18 2144  . folic acid (FOLVITE) tablet 1 mg  1 mg Oral Daily Auburn Bilberry, MD   1 mg at 09/14/18 0911  . gemfibrozil (LOPID) tablet 600 mg  600 mg Oral BID AC Sudini, Srikar, MD   600 mg at 09/14/18 0912  . hydrALAZINE (APRESOLINE) injection 10 mg  10 mg Intravenous Q6H PRN Auburn Bilberry, MD      . HYDROcodone-acetaminophen (NORCO/VICODIN) 5-325 MG per tablet 1-2 tablet  1-2 tablet Oral Q4H PRN Auburn Bilberry, MD   1 tablet at 09/12/18 0401  . HYDROmorphone (DILAUDID) injection 2 mg  2 mg Intravenous Q3H PRN Auburn Bilberry, MD   2 mg at 09/11/18 1019  . metoprolol tartrate (LOPRESSOR) tablet 50 mg  50 mg Oral BID Milagros Loll, MD   50 mg  at 09/14/18 0911  . multivitamin with minerals tablet 1 tablet  1 tablet Oral Daily Auburn Bilberry, MD   1 tablet at 09/14/18 0911  . ondansetron (ZOFRAN) tablet 4 mg  4 mg Oral Q6H PRN Auburn Bilberry, MD       Or  . ondansetron [facility]) injection 4 mg  4 mg Intravenous Q6H PRN Auburn Bilberry, MD      . piperacillin-tazobactam (ZOSYN) IVPB 3.375 g  3.375 g Intravenous Q8H Sudini, Srikar, MD 12.5 mL/hr at 09/14/18 0625 3.375 g at 09/14/18 0625  . thiamine (VITAMIN B-1) tablet 100 mg  100 mg Oral Daily Auburn Bilberry, MD   100 mg at 09/14/18 1610   Or  . thiamine (B-1) injection 100 mg  100 mg Intravenous Daily Auburn Bilberry, MD         Abtx:  Anti-infectives (From admission, onward)   Start     Dose/Rate Route Frequency Ordered Stop   09/13/18 1500  piperacillin-tazobactam (ZOSYN) IVPB 3.375 g     3.375 g 12.5 mL/hr over 240 Minutes Intravenous Every 8 hours 09/13/18 1351     09/11/18 1200  meropenem (MERREM) 1 g in sodium chloride 0.9 % 100 mL IVPB  Status:  Discontinued     1 g 200 mL/hr over 30 Minutes Intravenous Every 8 hours 09/11/18 1058 09/13/18 1351      REVIEW OF SYSTEMS:  Const: negative fever, negative chills, negative weight loss Eyes: negative diplopia or visual changes, negative eye pain ENT: negative coryza, negative sore throat Resp: negative cough, hemoptysis, dyspnea Cards: negative for chest pain, palpitations, lower extremity edema GU: had dark urine on admission but clear now GI:  abdominal pain, No diarrhea, bleeding, constipation Skin: negative for rash and pruritus Heme: negative for easy bruising and gum/nose bleeding MS: negative for myalgias, arthralgias, back pain and muscle weakness Neurolo:negative for headaches, dizziness, vertigo, memory problems  Psych: negative for feelings of anxiety, depression  Endocrine: negative for thyroid, diabetes Allergy/Immunology- negative for any medication or food allergies ? Pertinent Positives include :  Objective:  VITALS:  BP (!) 125/92 (BP Location: Left Arm)   Pulse (!) 107   Temp 98.6 F (37 C) (Oral)   Resp 19   Ht  (1.651 m)   Wt 73.9 kg   SpO2 100%   BMI 27.11 kg/m  PHYSICAL EXAM:  General: Alert, cooperative, no distress, appears stated age.  Head: Normocephalic, without obvious abnormality, atraumatic. Eyes: Conjunctivae clear, anicteric sclerae. Pupils are equal ENT Nares normal. No drainage or sinus tenderness. Lips, mucosa, and tongue normal. No Thrush Neck: Supple, symmetrical, no adenopathy, thyroid: non tender no carotid bruit and no JVD. Back: No CVA tenderness. Lungs: Clear to auscultation bilaterally. No Wheezing or Rhonchi. No rales. Heart: Regular rate and rhythm, no murmur, rub or gallop. Abdomen: Soft, non-tender,minimal distension. Bowel sounds normal. No masses Extremities: atraumatic, no cyanosis. No edema. No clubbing Skin: No rashes or lesions. Or bruising Lymph: Cervical, supraclavicular normal. Neurologic: Grossly non-focal Pertinent Labs Lab Results CBC    Component Value Date/Time   WBC 10.3 09/13/2018 0329   RBC 3.81 (L) 09/13/2018 0329   HGB 11.1 (L) 09/13/2018 0329   HCT 33.7 (L) 09/13/2018 0329   PLT 174 09/13/2018 0329   MCV 88.5 09/13/2018 0329   MCH 29.1 09/13/2018 0329   MCHC 32.9 09/13/2018 0329   RDW 15.8 (H) 09/13/2018 0329   LYMPHSABS 1.4 09/13/2018 0329   MONOABS 1.0 09/13/2018 0329   EOSABS 0.2 09/13/2018 0329   BASOSABS 0.0 09/13/2018 0329    CMP Latest Ref Rng & Units 09/13/2018 09/12/2018 09/11/2018  Glucose 70 - 99 mg/dL 195(K) 932(I) 712(W)  BUN 6 - 20 mg/dL 8 6 6   Creatinine 0.61 - 1.24 mg/dL 5.80 9.98 3.38  Sodium 135 - 145 mmol/L 135 135 136  Potassium 3.5 - 5.1 mmol/L 3.7 3.4(L) 3.9  Chloride 98 - 111 mmol/L 108 108 101  CO2 22 - 32 mmol/L 18(L) 20(L) 27  Calcium 8.9 - 10.3 mg/dL 8.1(L) 7.0(L) 6.9(L)  Total Protein 6.5 - 8.1 g/dL 6.4(L) 5.5(L) 5.1(L)  Total Bilirubin 0.3 - 1.2 mg/dL 1.2 1.2 0.9   Alkaline Phos 38 - 126 U/L 67 53 62  AST 15 - 41 U/L 48(H) 40 63(H)  ALT 0 - 44 U/L 71(H) 78(H) 131(H)      Microbiology: Recent Results (from the past 240 hour(s))  CULTURE, BLOOD (ROUTINE X 2) w Reflex to ID Panel     Status: None (Preliminary result)   Collection Time: 09/11/18 11:36 AM  Result Value Ref Range Status   Specimen Description BLOOD LEFT ANTECUBITAL  Final   Special Requests   Final    BOTTLES DRAWN AEROBIC AND ANAEROBIC Blood Culture adequate volume   Culture   Final    NO GROWTH 3 DAYS Performed at [facility], [address], [place], [place] [address]    Report Status PENDING  Incomplete  CULTURE, BLOOD (ROUTINE X 2) w Reflex to ID Panel     Status: None (Preliminary result)   Collection Time: 09/11/18 11:50 AM  Result Value Ref Range Status   Specimen Description BLOOD RIGHT ANTECUBITAL  Final   Special Requests   Final    BOTTLES DRAWN AEROBIC AND ANAEROBIC Blood Culture adequate volume   Culture   Final    NO GROWTH 3 DAYS Performed at [facility], [address] [place], [place] [address]    Report Status PENDING  Incomplete    IMAGING RESULTS:   Acute pancreatitis with extensive peripancreatic edema and small volume ascites. I have personally reviewed the films ? Impression/Recommendation Acute pancreatitis with severe pancreatic edema and fluid around- fever very likely due to the pancreatic inflammation. No antibiotic needed IF fever persist repeat Ct to look for necrosis of pancrease or development of pseudocyst Pt does not look septic and clinically doing better Atelectasis- will need Incentive spirometry- may do  CXR if fever persist Discussed with patient and Dr.Sudini ID will sign off -call if needed? ?

## 2018-09-16 LAB — CULTURE, BLOOD (ROUTINE X 2)
Culture: NO GROWTH
Culture: NO GROWTH
SPECIAL REQUESTS: ADEQUATE
Special Requests: ADEQUATE

## 2018-09-29 NOTE — Discharge Summary (Signed)
[facility] [place] [facility]   PATIENT NAME: Calvin FischerCedric Bell    MR#:  [id number]  DATE OF BIRTH:  07-27-1975  DATE OF ADMISSION:  09/10/2018 ADMITTING PHYSICIAN: Auburn BilberryShreyang Patel, MD  DATE OF DISCHARGE: 09/14/2018  4:00 PM  PRIMARY CARE PHYSICIAN: [facility]   ADMISSION DIAGNOSIS:  Abdominal pain [R10.9] Alcohol-induced acute pancreatitis, unspecified complication status [K85.20]  DISCHARGE DIAGNOSIS:  Active Problems:   Alcoholic pancreatitis   SECONDARY DIAGNOSIS:   Past Medical History:  Diagnosis Date  . Acid reflux   . HTN (hypertension)   . Hypercholesteremia   . Hypertriglyceridemia      ADMITTING HISTORY  HISTORY OF PRESENT ILLNESS: Calvin FischerCedric Bell  is a 43 y.o. male with a known history of GERD, hypertension and hypercholesteremia presenting to the emergency room with abdominal pain which started 2 days ago.  The pain is epigastric sharp in nature.  And it got severe today.  Patient did not have any nausea vomiting.  Patient states that he has been drinking 4-5 beers daily with few shots.  Denies previous history of having pancreatitis.  HOSPITAL COURSE:   Patient is 43 year old presenting with abdominal pain  * SIRS Sepsis suspected initially Fever and tachycardia.  Etiology unclear.   CT scan of the abdomen and pelvis showed pancreatitis.  No necrosis or abscess. Started on meropenem.   Blood cultures negative.  Seen by infectious disease Dr. Rivka Saferavishankar Sepsis ruled out.  His fever and tachycardia are likely due to inflammatory reaction from acute pancreatitis.  *Acute alcoholic pancreatitis No gallstones on ultrasound. Triglycerides 1045.  Could also be contributing to his pancreatitis IV fluids. Pain medications as needed Pain is significantly improved and patient tolerated diet.  Ambulating well in the hallway.  *Hypertriglyceridemia.  Improved to 400  Due to alcohol. Restarted atorvastatin from home that he was not  taking.  Added gemfibrozil in the hospital and not prescribed due to increased risk of rhabdomyolysis and significant drop in triglyceride levels. Lifestyle changes discussed  *Hyperglycemia.  Likely due to acute stress.  Hemoglobin A1c is 5.6.  *Elevated liver function tests likely due to alcoholic hepatitis Improving  *Alcohol abuse CIWA protocol  *Hypertension due to pain  *Venous Lovenox for DVT prophylaxis  Patient discharged home in stable condition after being found afebrile for 24 hours.  CONSULTS OBTAINED:    DRUG ALLERGIES:  No Known Allergies  DISCHARGE MEDICATIONS:   Allergies as of 09/14/2018   No Known Allergies     Medication List    TAKE these medications   ALPRAZolam 0.25 MG tablet Commonly known as:  XANAX Take 0.25 mg by mouth daily as needed.   atorvastatin 10 MG tablet Commonly known as:  LIPITOR Take 10 mg by mouth daily.   HYDROcodone-acetaminophen 5-325 MG tablet Commonly known as:  Norco Take 1 tablet by mouth every 6 (six) hours as needed for severe pain.   lisinopril 10 MG tablet Commonly known as:  PRINIVIL,ZESTRIL Take 1 tablet (10 mg total) by mouth daily.       Today   VITAL SIGNS:  Blood pressure (!) 125/92, pulse (!) 107, temperature 99.3 F (37.4 C), resp. rate 19, height 5\' 5"  (1.651 m), weight 73.9 kg, SpO2 100 %.  I/O:  No intake or output data in the 24 hours ending 09/29/18 1310  PHYSICAL EXAMINATION:  Physical Exam  GENERAL:  43 y.o.-year-old patient lying in the bed with no acute distress.  LUNGS: Normal breath sounds bilaterally, no wheezing, rales,rhonchi or  crepitation. No use of accessory muscles of respiration.  CARDIOVASCULAR: S1, S2 normal. No murmurs, rubs, or gallops.  ABDOMEN: Soft, non-tender, non-distended. Bowel sounds present. No organomegaly or mass.  NEUROLOGIC: Moves all 4 extremities. PSYCHIATRIC: The patient is alert and oriented x 3.  SKIN: No obvious rash, lesion, or ulcer.    DATA REVIEW:   CBC No results for input(s): WBC, HGB, HCT, PLT in the last 168 hours.  Chemistries  No results for input(s): NA, K, CL, CO2, GLUCOSE, BUN, CREATININE, CALCIUM, MG, AST, ALT, ALKPHOS, BILITOT in the last 168 hours.  Invalid input(s): GFRCGP  Cardiac Enzymes No results for input(s): TROPONINI in the last 168 hours.  Microbiology Results  Results for orders placed or performed during the hospital encounter of 09/10/18  CULTURE, BLOOD (ROUTINE X 2) w Reflex to ID Panel     Status: None   Collection Time: 09/11/18 11:36 AM  Result Value Ref Range Status   Specimen Description BLOOD LEFT ANTECUBITAL  Final   Special Requests   Final    BOTTLES DRAWN AEROBIC AND ANAEROBIC Blood Culture adequate volume   Culture   Final    NO GROWTH 5 DAYS Performed at [facility], [address] [place], [place] [address]    Report Status 09/16/2018 FINAL  Final  CULTURE, BLOOD (ROUTINE X 2) w Reflex to ID Panel     Status: None   Collection Time: 09/11/18 11:50 AM  Result Value Ref Range Status   Specimen Description BLOOD RIGHT ANTECUBITAL  Final   Special Requests   Final    BOTTLES DRAWN AEROBIC AND ANAEROBIC Blood Culture adequate volume   Culture   Final    NO GROWTH 5 DAYS Performed at [facility], [address] [place], [place] [address]    Report Status 09/16/2018 FINAL  Final    RADIOLOGY:  No results found.  Follow up with PCP in 1 week.  Management plans discussed with the patient, family and they are in agreement.  CODE STATUS:  Code Status History    Date Active Date Inactive Code Status Order ID Comments User Context   09/10/2018 1408 09/14/2018 1921 Full Code [id number]  Auburn Bilberry, MD Inpatient      TOTAL TIME TAKING CARE OF THIS PATIENT ON DAY OF DISCHARGE: more than 30 minutes.   Molinda Bailiff Leidi Astle M.D on 09/29/2018 at 1:10 PM  Between 7am to 6pm - Pager - [phone]  After 6pm go to [email or web address] - password [email or web address]  [facility]  [facility] [place] [facility]  Office  [phone]  CC: Primary care physician; Raynelle Bring  Note: This dictation was prepared with Dragon dictation along with smaller phrase technology. Any transcriptional errors that result from this process are unintentional.

## 2020-06-28 IMAGING — CR DG ABDOMEN ACUTE W/ 1V CHEST
1 series · 3 of 3 positions shown · non-contrast
Comparison: None.

CLINICAL DATA: Severe epigastric pain since yesterday.

EXAM:
DG ABDOMEN ACUTE W/ 1V CHEST

[Series 1: view not recorded · 0.14mm/px · 3 of 3 slices shown]
[im 1/3]
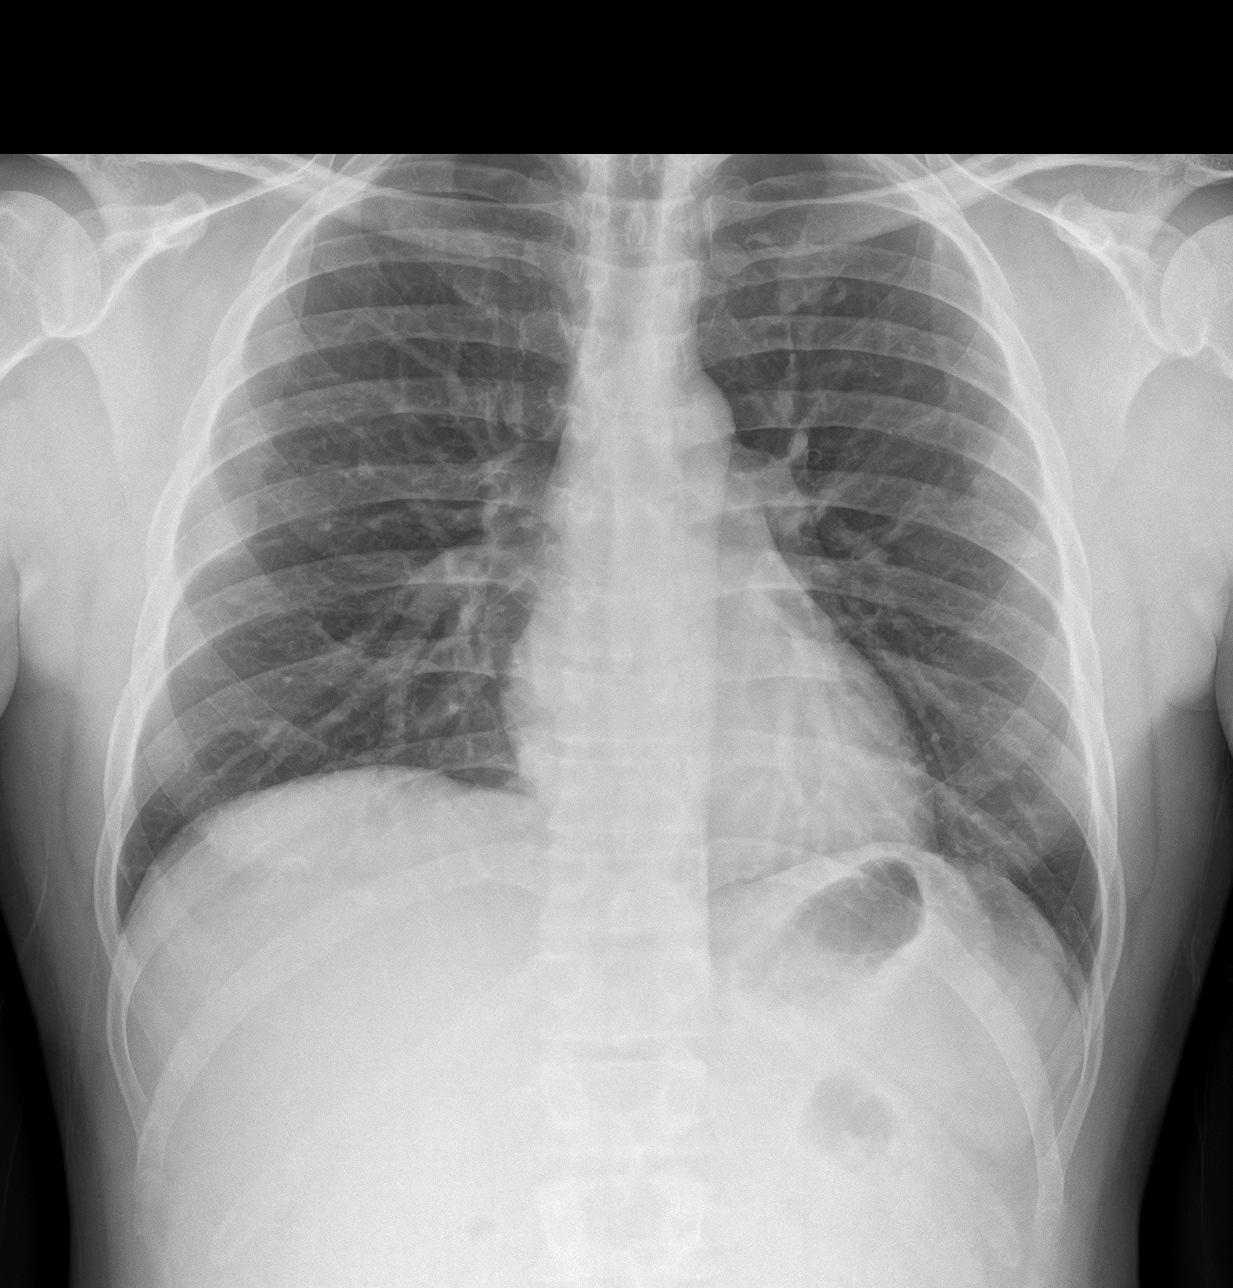
[im 2/3]
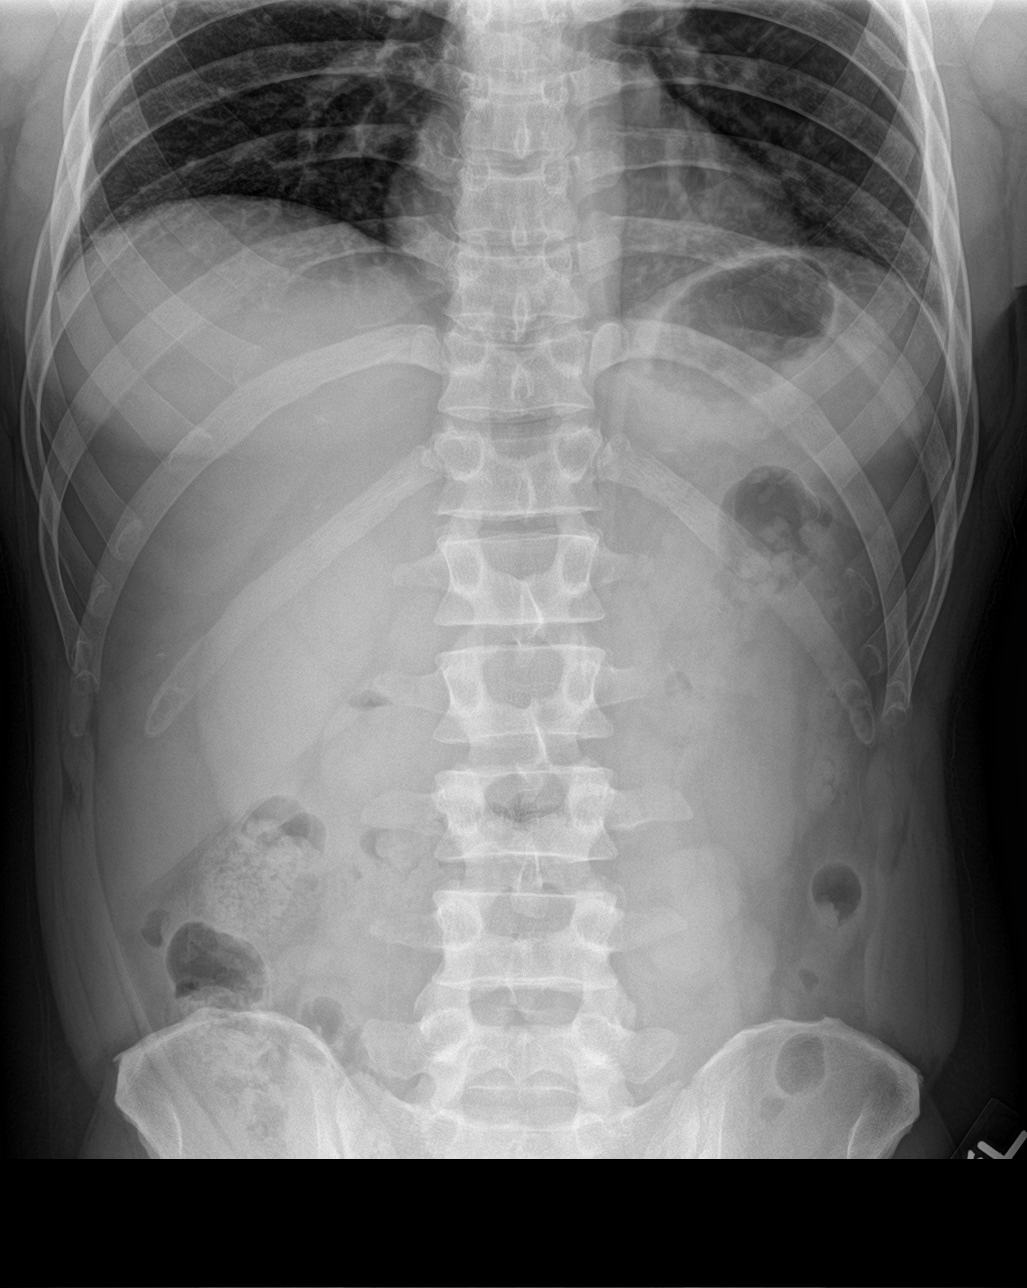
[im 3/3]
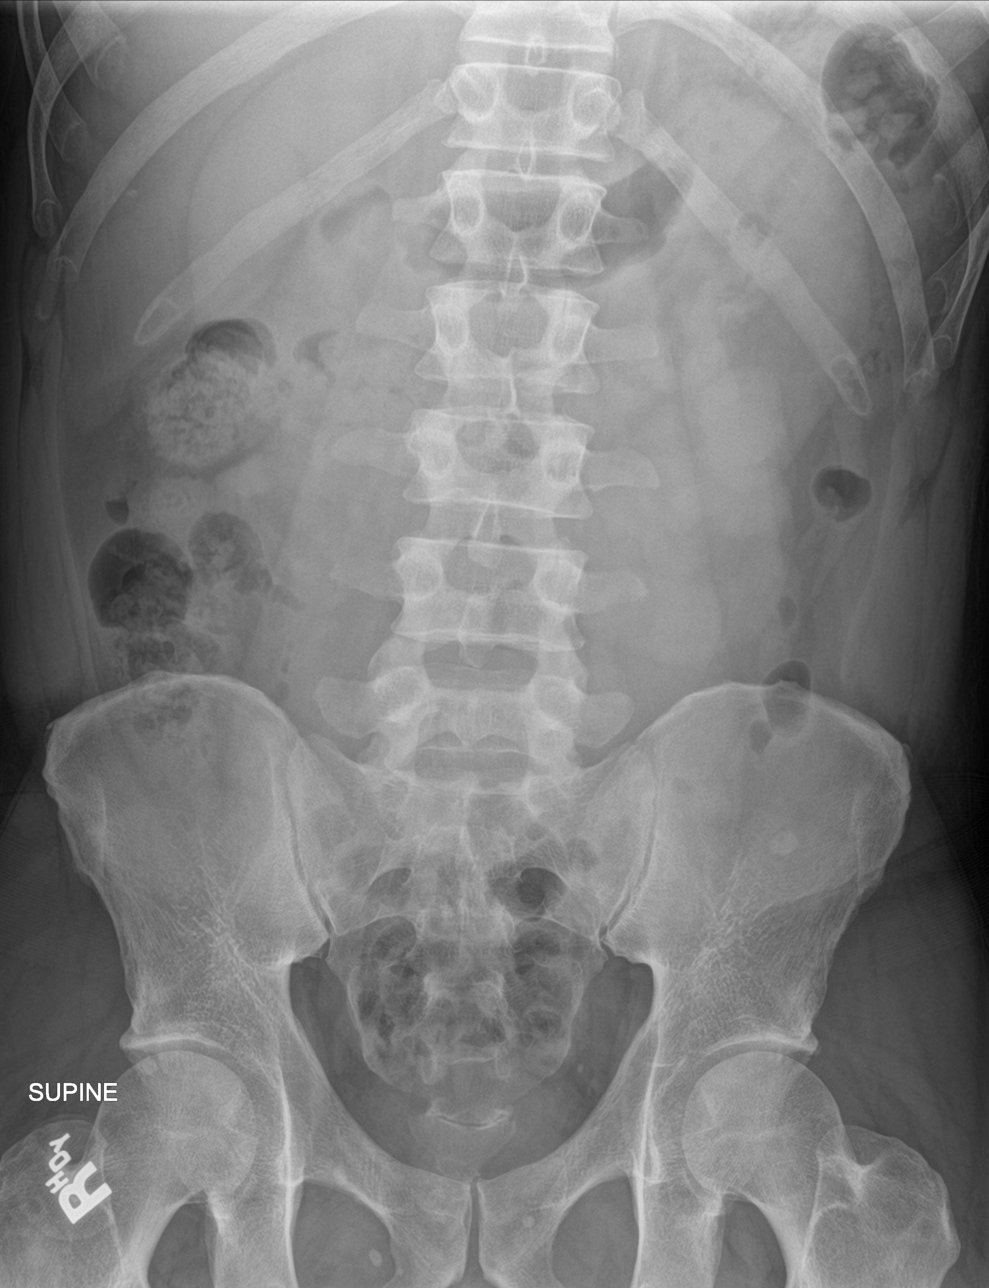

[3 of 3 positions shown; findings below may reference images not displayed]

FINDINGS: Lungs are clear. Cardiomediastinal silhouette and remainder of the
chest is within normal.

Abdominopelvic images demonstrate a nonobstructive bowel gas
pattern. There is no free peritoneal air. Bones and soft tissues are
unremarkable.
IMPRESSION: Negative abdominal radiographs.  No acute cardiopulmonary disease.

## 2020-06-28 IMAGING — US ULTRASOUND ABDOMEN LIMITED
1 series · 14 of 25 positions shown · non-contrast
Comparison: None.

CLINICAL DATA: Epigastric pain for 3 days. Elevated liver function
tests.

EXAM:
ULTRASOUND ABDOMEN LIMITED RIGHT UPPER QUADRANT

[Series 1: ultrasound abdomen limited · 14 of 45 slices shown]
[im 1/45]
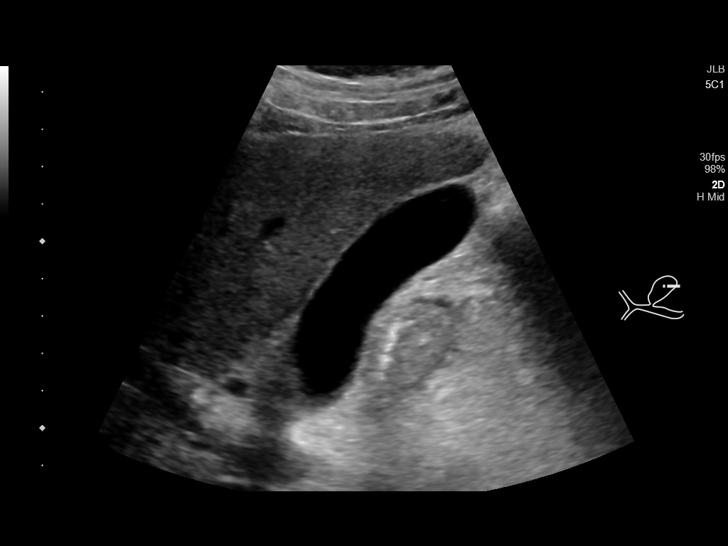
[im 4/45]
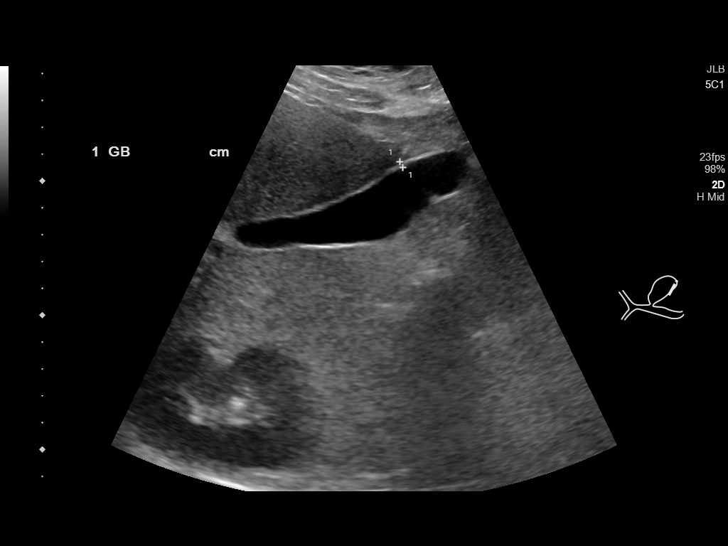
[im 8/45]
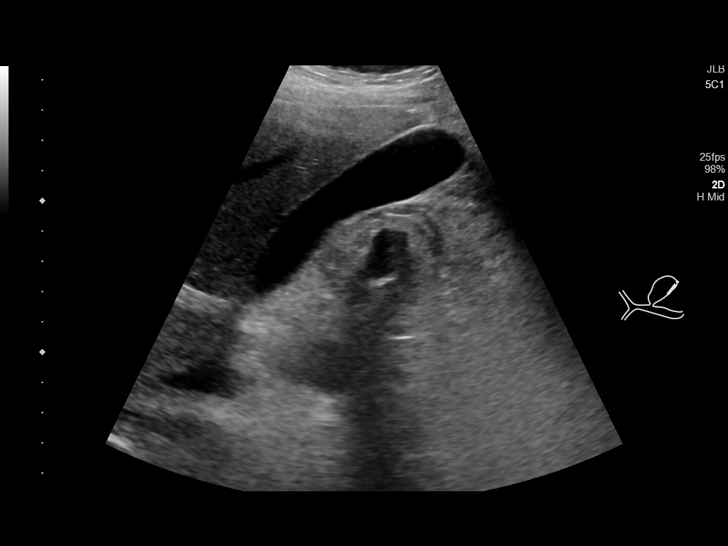
[im 12/45]
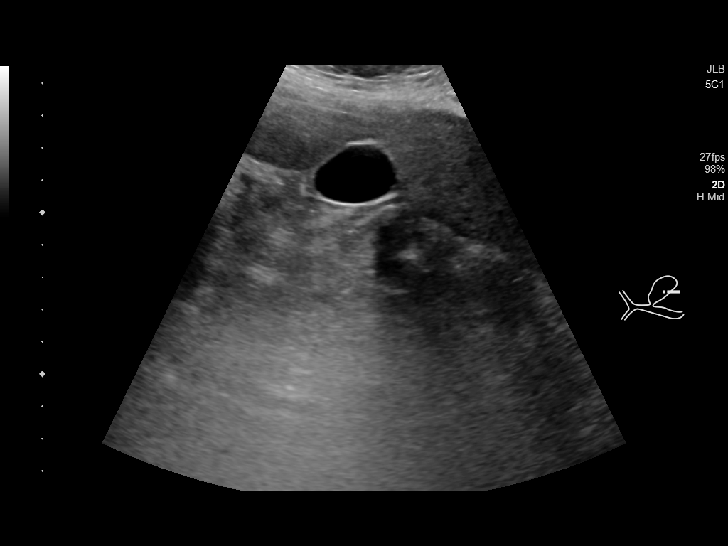
[im 15/45]
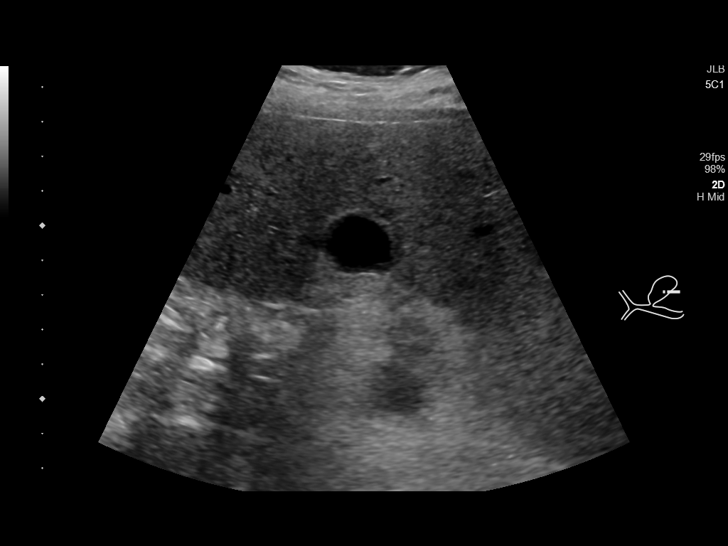
[im 17/45]
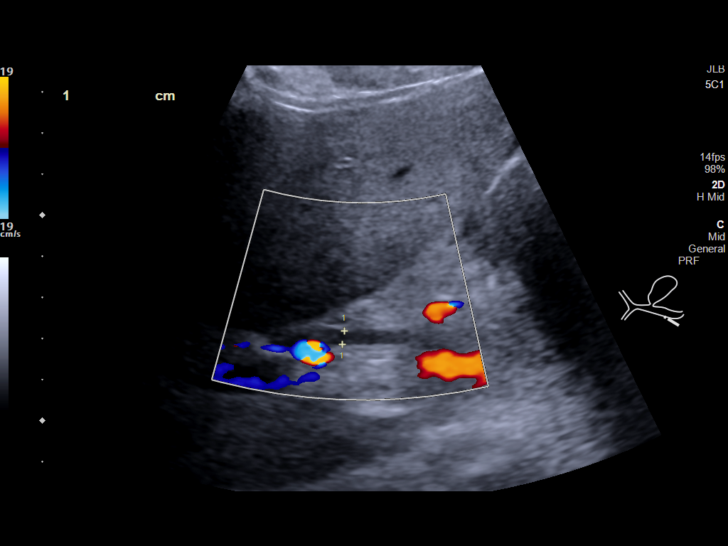
[im 21/45]
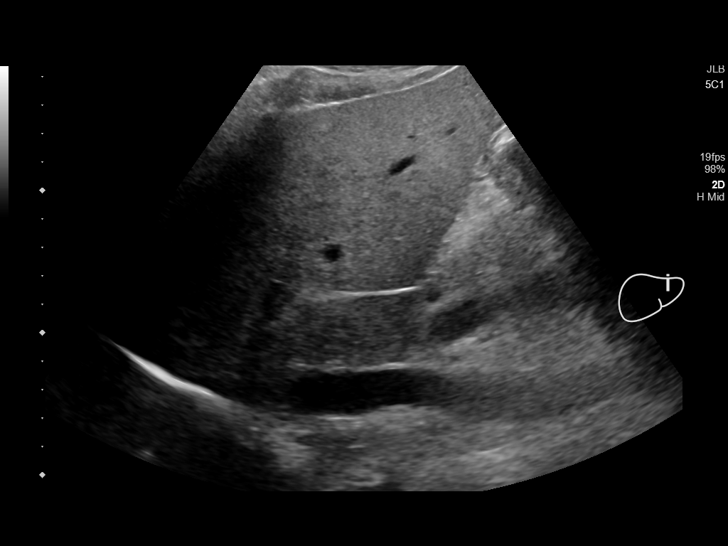
[im 24/45]
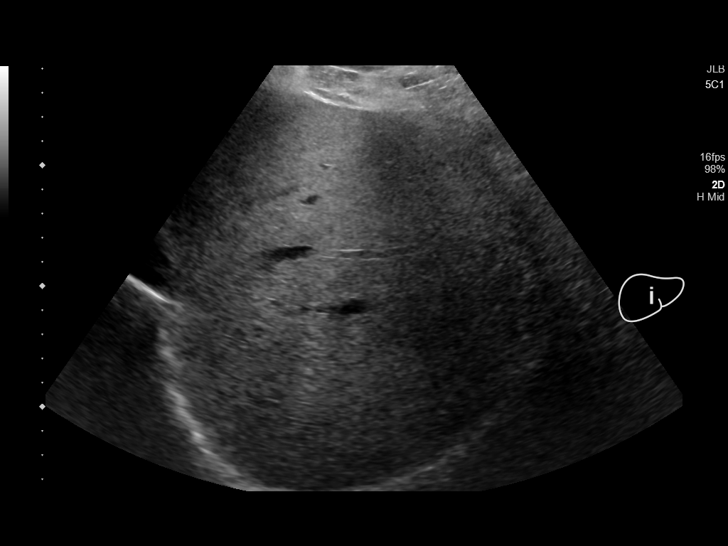
[im 28/45]
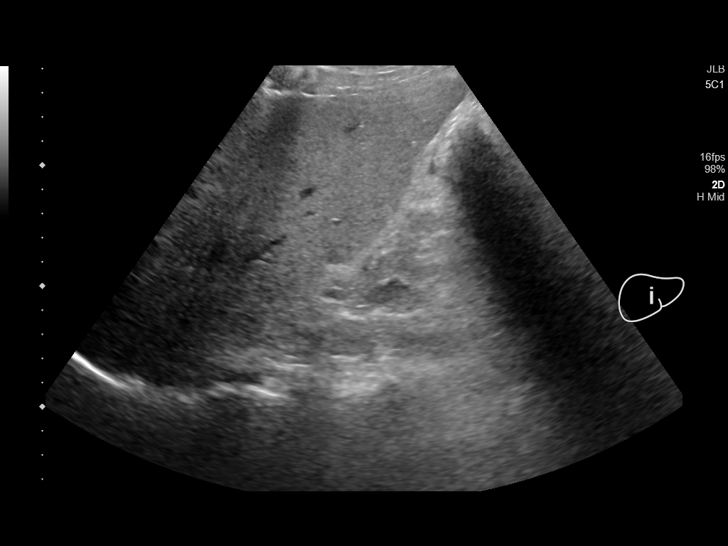
[im 30/45]
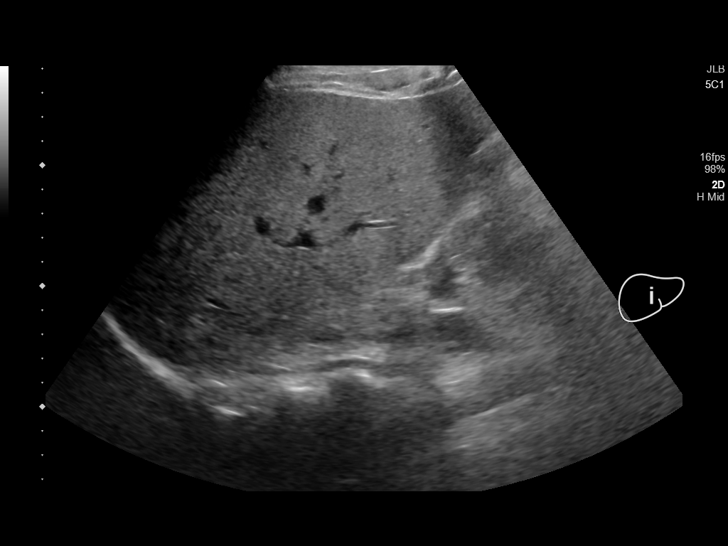
[im 34/45]
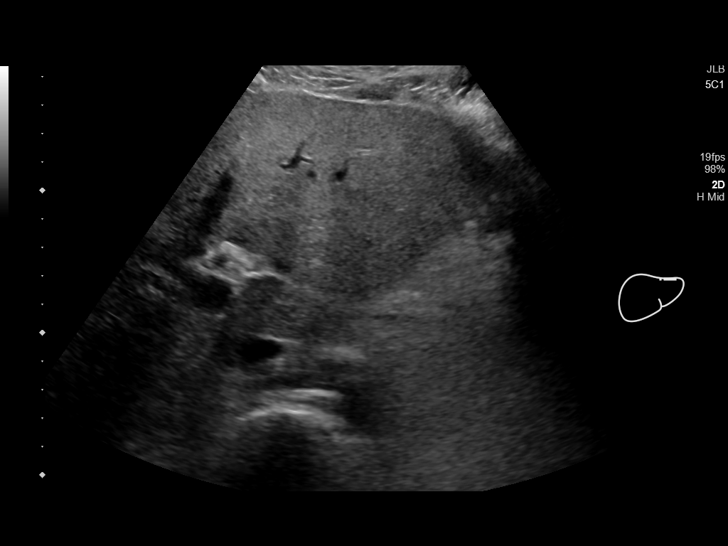
[im 37/45]
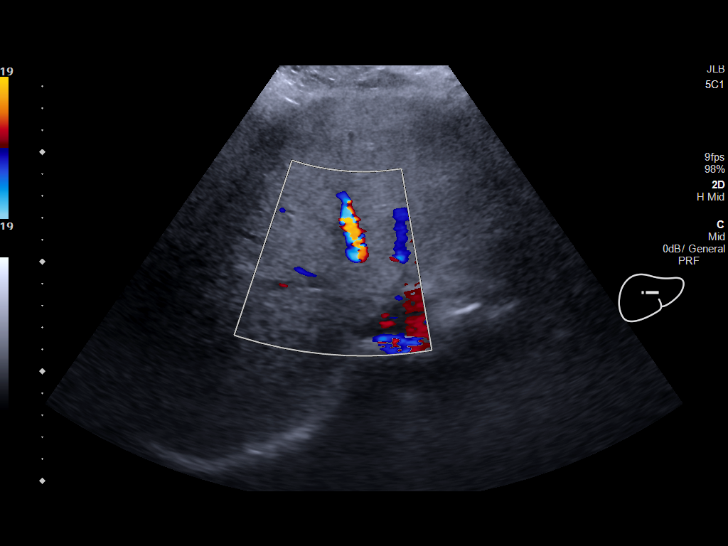
[im 41/45]
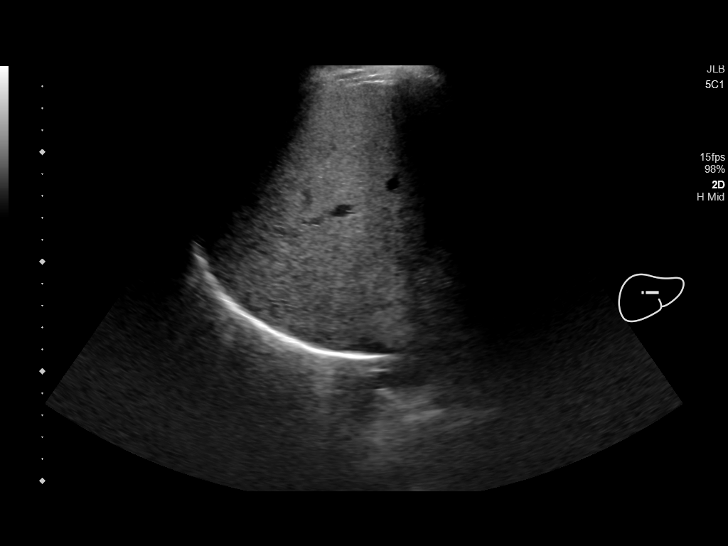
[im 45/45]
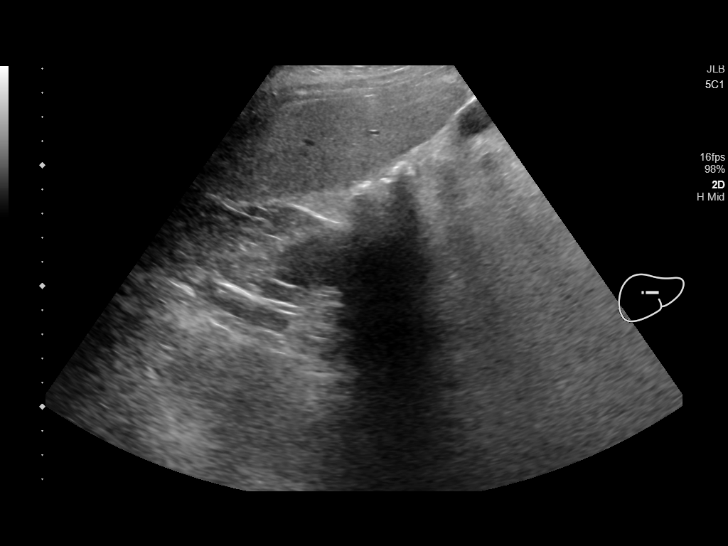

[14 of 25 positions shown; findings below may reference images not displayed]

FINDINGS: Gallbladder:

No gallstones or wall thickening visualized. No sonographic Murphy
sign noted by sonographer.

Common bile duct:

Diameter: 0.3 cm.

Liver:

No focal lesion. Echogenicity is somewhat increased the liver
appears dense. Portal vein is patent on color Doppler imaging with
normal direction of blood flow towards the liver.
IMPRESSION: No acute abnormality.  Negative for gallstones.

Fatty infiltration of the liver.
